# Patient Record
Sex: Male | Born: 1980 | Race: White | Hispanic: No | Marital: Married | Smoking: Never smoker
Health system: Southern US, Community
[De-identification: ages and names within clinical notes are randomized; demographics above are authoritative.]

## PROBLEM LIST (undated history)

## (undated) DIAGNOSIS — M109 Gout, unspecified: Secondary | ICD-10-CM

## (undated) HISTORY — DX: Gout, unspecified: M10.9

---

## 2006-06-14 ENCOUNTER — Ambulatory Visit: Payer: Self-pay | Admitting: General Practice

## 2008-01-23 IMAGING — CR RIGHT FOOT COMPLETE - 3+ VIEW
1 series · 3 of 3 positions shown · non-contrast
Comparison: none

REASON FOR EXAM: Pain
                              Fax report to: [phone]
COMMENTS:

[Series 1: view not recorded · 0.17mm/px · 3 of 3 slices shown]
[im 1/3]
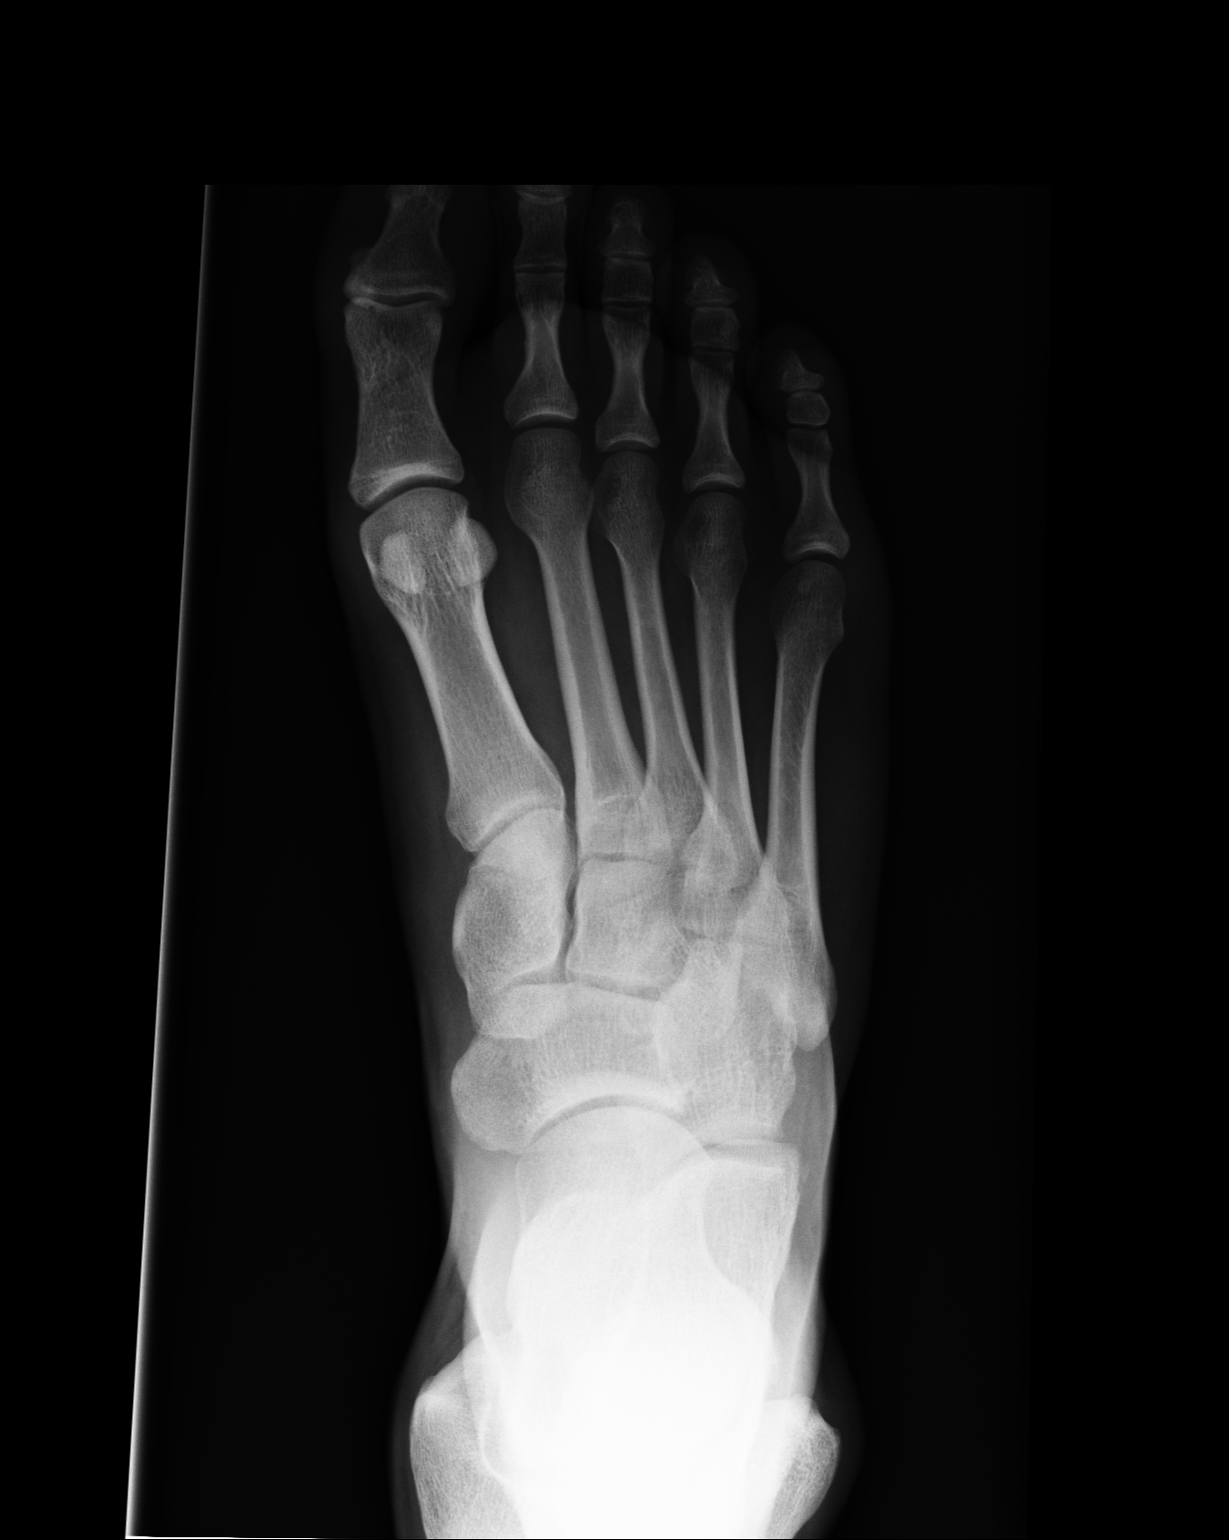
[im 2/3]
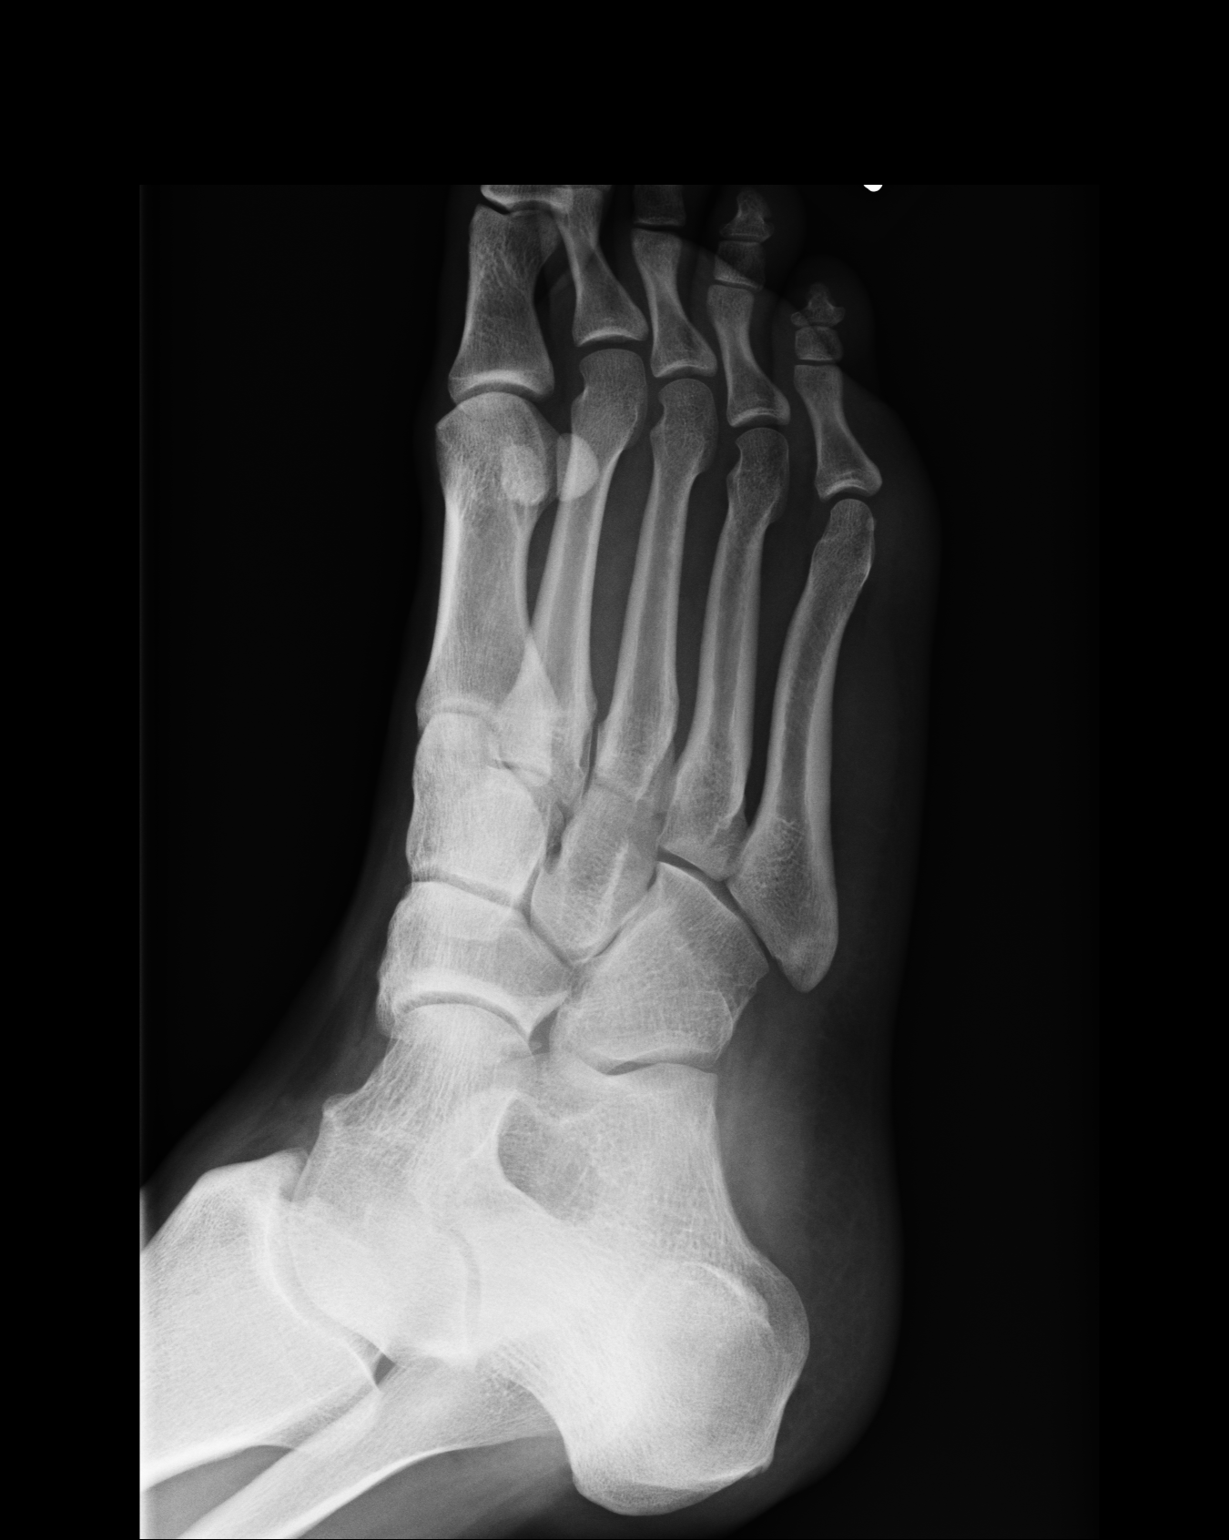
[im 3/3]
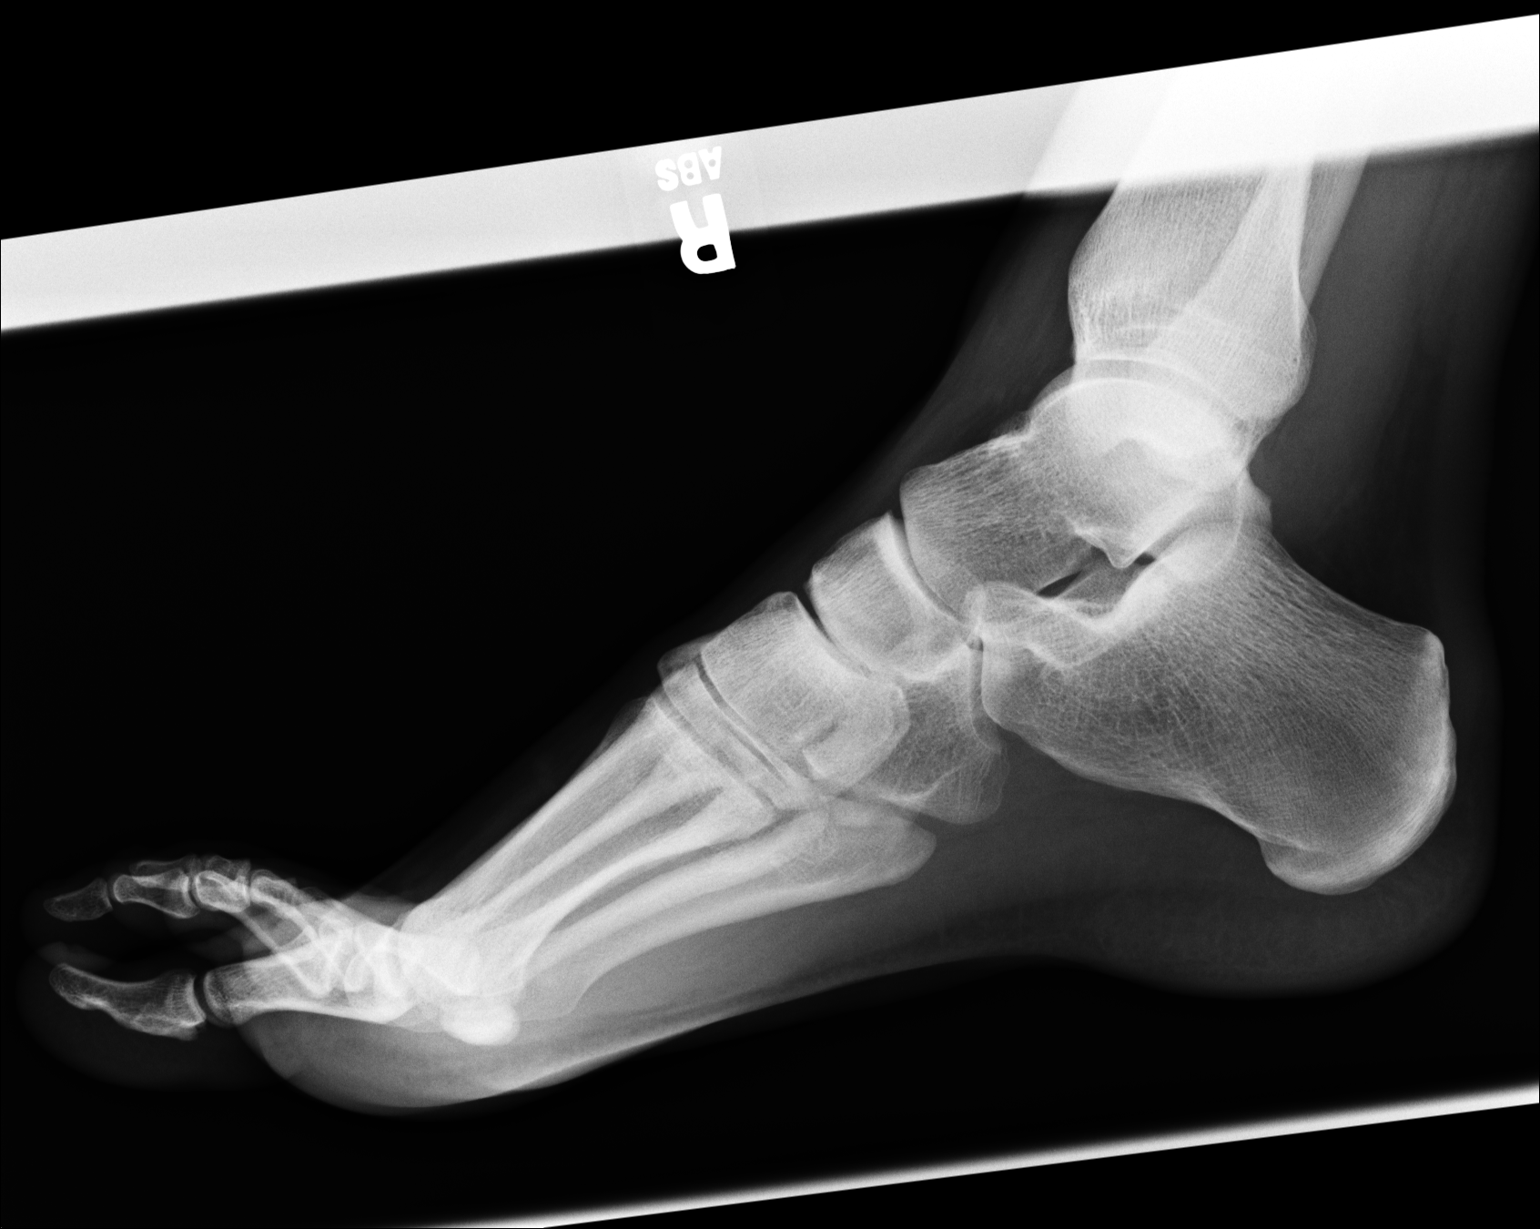

[3 of 3 positions shown; findings below may reference images not displayed]

PROCEDURE:     DXR - DXR FOOT RT COMPLETE W/OBLIQUES  - June 14, 2006 [DATE]

RESULT:     The patient is complaining of RIGHT foot discomfort. The site of
symptoms is not noted in the clinical history.

The bones appear adequately mineralized. The interphalangeal and
metatarsophalangeal joints are normal in appearance. The tarsometatarsal
joints appear normal as well. I do not see evidence of an acute fracture.
IMPRESSION: I do not see acute bony abnormality of the right foot.
Follow-up films coned to any areas of significant persistent symptoms would
be of value.

## 2013-11-08 ENCOUNTER — Emergency Department: Payer: Self-pay | Admitting: Emergency Medicine

## 2013-11-08 LAB — COMPREHENSIVE METABOLIC PANEL
ALK PHOS: 67 U/L
AST: 38 U/L — AB (ref 15–37)
Albumin: 4.4 g/dL (ref 3.4–5.0)
Anion Gap: 4 — ABNORMAL LOW (ref 7–16)
BILIRUBIN TOTAL: 0.6 mg/dL (ref 0.2–1.0)
BUN: 10 mg/dL (ref 7–18)
Calcium, Total: 9.4 mg/dL (ref 8.5–10.1)
Chloride: 106 mmol/L (ref 98–107)
Co2: 29 mmol/L (ref 21–32)
Creatinine: 1.4 mg/dL — ABNORMAL HIGH (ref 0.60–1.30)
EGFR (African American): >60
EGFR (Non-African Amer.): >60
Glucose: 106 mg/dL — ABNORMAL HIGH (ref 65–99)
OSMOLALITY: 277 (ref 275–301)
POTASSIUM: 4.4 mmol/L (ref 3.5–5.1)
SGPT (ALT): 60 U/L (ref 12–78)
Sodium: 139 mmol/L (ref 136–145)
Total Protein: 8.4 g/dL — ABNORMAL HIGH (ref 6.4–8.2)

## 2013-11-08 LAB — CBC
HCT: 45.7 % (ref 40.0–52.0)
HGB: 15.9 g/dL (ref 13.0–18.0)
MCH: 31.6 pg (ref 26.0–34.0)
MCHC: 34.8 g/dL (ref 32.0–36.0)
MCV: 91 fL (ref 80–100)
Platelet: 188 10*3/uL (ref 150–440)
RBC: 5.04 10*6/uL (ref 4.40–5.90)
RDW: 13.2 % (ref 11.5–14.5)
WBC: 6.9 10*3/uL (ref 3.8–10.6)

## 2013-11-08 LAB — LIPASE, BLOOD: Lipase: 197 U/L (ref 73–393)

## 2014-08-28 ENCOUNTER — Other Ambulatory Visit: Payer: Self-pay | Admitting: General Practice

## 2014-08-28 DIAGNOSIS — M545 Low back pain, unspecified: Secondary | ICD-10-CM

## 2014-09-05 ENCOUNTER — Ambulatory Visit
Admission: RE | Admit: 2014-09-05 | Discharge: 2014-09-05 | Disposition: A | Discharge status: Home / Self Care | Payer: 59 | Source: Ambulatory Visit | Attending: General Practice | Admitting: General Practice

## 2014-09-05 DIAGNOSIS — M545 Low back pain, unspecified: Secondary | ICD-10-CM

## 2014-09-05 DIAGNOSIS — M549 Dorsalgia, unspecified: Secondary | ICD-10-CM | POA: Diagnosis present

## 2014-09-05 DIAGNOSIS — M5136 Other intervertebral disc degeneration, lumbar region: Secondary | ICD-10-CM | POA: Insufficient documentation

## 2014-09-05 DIAGNOSIS — M47816 Spondylosis without myelopathy or radiculopathy, lumbar region: Secondary | ICD-10-CM | POA: Diagnosis not present

## 2014-09-05 DIAGNOSIS — M5137 Other intervertebral disc degeneration, lumbosacral region: Secondary | ICD-10-CM | POA: Diagnosis not present

## 2022-01-26 ENCOUNTER — Ambulatory Visit (INDEPENDENT_AMBULATORY_CARE_PROVIDER_SITE_OTHER): Payer: Commercial Managed Care - PPO | Admitting: Family Medicine

## 2022-01-26 ENCOUNTER — Encounter: Payer: Self-pay | Admitting: Family Medicine

## 2022-01-26 VITALS — BP 150/94 | HR 60 | Ht 75.0 in | Wt 267.0 lb

## 2022-01-26 DIAGNOSIS — Z7689 Persons encountering health services in other specified circumstances: Secondary | ICD-10-CM

## 2022-01-26 DIAGNOSIS — G8929 Other chronic pain: Secondary | ICD-10-CM

## 2022-01-26 DIAGNOSIS — M1A09X0 Idiopathic chronic gout, multiple sites, without tophus (tophi): Secondary | ICD-10-CM

## 2022-01-26 DIAGNOSIS — M2550 Pain in unspecified joint: Secondary | ICD-10-CM | POA: Diagnosis not present

## 2022-01-26 DIAGNOSIS — M7660 Achilles tendinitis, unspecified leg: Secondary | ICD-10-CM

## 2022-01-26 NOTE — Progress Notes (Unsigned)
Subjective:    Patient ID: Gregory Brandt, male    DOB: 1980/11/01, 41 y.o.   MRN: [id number]  Gregory Brandt is a 41 y.o. male presenting on 01/26/2022 for Establish Care  Wife established here yesterday as new patient. Has not seen PCP in several years. He has used [facility] more regularly. Biometric through work  HPI  Elevated BP without dx HTN In pain in AM taking aleve, coffee 2 cups  Gout Pain Generalized Joint Pain Admits bilateral ankles, hands wrist He has had gout since high school age, he usually has gout flare pain in toes and rarely he had pain in knee. He admits now pain is in middle of foot. He has chronic Uric Acid elevated Previously on Allopurinol in the past. He said it helped but didn't do a lot. Previously was on Indomethacin with no results. He was then placed on Colchicine and Prednisone PRN, it does work but does not resolve it. Taking a lot of Aleve daily regularly, doesn't resolve the problem. Admits neck pressure and symptoms. If he is very active he will be in increased pain for a few days Played football and baseball through high school and college. Various injuries but no severe injury He used to work Horticulturist, commercial business, and he said nowadays he has very reduced power and strength, loss grip strength Prednisone  He has biometric from work All labs were normal except High TG off the charts and mild low HDL He follows with health coach through work yearly past 4 years, he has improved his diet. May have familial or genetic component for his TG, he meets with dietician every 2 months, he has reduced cholesterol in diet, and reduced carbs starches. - He has had normal blood sugar. - DM does run in family   Health Maintenance: Declines Flu Shot     01/26/2022   10:10 AM  Depression screen PHQ 2/9  Decreased Interest 0  Down, Depressed, Hopeless 0  PHQ - 2 Score 0  Altered sleeping 3  Tired, decreased energy 3  Change in  appetite 3  Feeling bad or failure about yourself  1  Trouble concentrating 0  Moving slowly or fidgety/restless 0  Suicidal thoughts 0  PHQ-9 Score 10  Difficult doing work/chores Very difficult    Past Medical History:  Diagnosis Date   Gout    History reviewed. No pertinent surgical history. Social History   Socioeconomic History   Marital status: Married    Spouse name: Not on file   Number of children: Not on file   Years of education: Not on file   Highest education level: Not on file  Occupational History   Not on file  Tobacco Use   Smoking status: Never   Smokeless tobacco: Current    Types: Chew   Tobacco comments:    Smokeless tobacco 1 can for 25 years  Vaping Use   Vaping Use: Never used  Substance and Sexual Activity   Alcohol use: Yes    Alcohol/week: 1.0 standard drink of alcohol    Types: 1 Standard drinks or equivalent per week   Drug use: Never   Sexual activity: Not on file  Other Topics Concern   Not on file  Social History Narrative   Not on file   Social Determinants of Health   Financial Resource Strain: Not on file  Food Insecurity: Not on file  Transportation Needs: Not on file  Physical Activity: Not on file  Stress: Not on file  Social Connections: Not on file  Intimate Partner Violence: Not on file   History reviewed. No pertinent family history. No current outpatient medications on file prior to visit.   No current facility-administered medications on file prior to visit.    Review of Systems Per HPI unless specifically indicated above      Objective:    BP (!) 150/94 (BP Location: Left Arm, Cuff Size: Normal)   Pulse 60   Ht 6\' 3"  (1.905 m)   Wt 267 lb (121.1 kg)   SpO2 100%   BMI 33.37 kg/m   Wt Readings from Last 3 Encounters:  01/26/22 267 lb (121.1 kg)  09/05/14 250 lb (113.4 kg)    Physical Exam Vitals and nursing note reviewed.  Constitutional:      General: He is not in acute distress.     Appearance: He is well-developed. He is not diaphoretic.     Comments: Well-appearing, comfortable, cooperative  HENT:     Head: Normocephalic and atraumatic.  Eyes:     General:        Right eye: No discharge.        Left eye: No discharge.     Conjunctiva/sclera: Conjunctivae normal.  Neck:     Thyroid: No thyromegaly.  Cardiovascular:     Rate and Rhythm: Normal rate and regular rhythm.     Pulses: Normal pulses.     Heart sounds: Normal heart sounds. No murmur heard. Pulmonary:     Effort: Pulmonary effort is normal. No respiratory distress.     Breath sounds: Normal breath sounds. No wheezing or rales.  Musculoskeletal:        General: Normal range of motion.     Cervical back: Normal range of motion and neck supple.  Lymphadenopathy:     Cervical: No cervical adenopathy.  Skin:    General: Skin is warm and dry.     Findings: No erythema or rash.  Neurological:     Mental Status: He is alert and oriented to person, place, and time. Mental status is at baseline.  Psychiatric:        Behavior: Behavior normal.     Comments: Well groomed, good eye contact, normal speech and thoughts    No results found for this or any previous visit.    Assessment & Plan:   Problem List Items Addressed This Visit   None Visit Diagnoses     Chronic pain of multiple joints    -  Primary   Relevant Orders   COMPLETE METABOLIC PANEL WITH GFR   CBC with Differential/Platelet   Sed Rate (ESR)   C-reactive protein   Cyclic citrul peptide antibody, IgG   Antinuclear Antib (ANA)   Rheumatoid Factor   Achilles tendon pain       Chronic gout of multiple sites, unspecified cause       Relevant Orders   Sed Rate (ESR)   C-reactive protein   Uric acid   Encounter to establish care with new doctor           Establish care  BP improved but still elevated. Recommend check BP on occasion, few times a week if possible. May need to purchase your own cuff. Goal < 140 / 90 (the stretch goal  would be < 135 / 85)  Try to limit or reduce triggers - only so much we can manage with the pain right now, but otherwise caffeine, nicotine and stressors contribute.  Low sodium diet  Lab testing today. Stay tuned for results. Diagnostic panel for possible autoimmune vs rheumatological condition, given chronic nature of gout and various joint / muscle pain syndrome  Will review screening/diagnostic results and anticipate future referral to Rheumatology, list per AVS   No orders of the defined types were placed in this encounter.     Follow up plan: Return in about 6 weeks (around 03/09/2022) for 6 week follow-up BP check, update Rheum.  Nobie Putnam, [facility] 01/26/2022, 10:23 AM

## 2022-01-26 NOTE — Patient Instructions (Addendum)
Thank you for coming to the office today.  BP improved but still elevated. Recommend check BP on occasion, few times a week if possible. May need to purchase your own cuff. Goal < 140 / 90 (the stretch goal would be < 135 / 85)  Try to limit or reduce triggers - only so much we can manage with the pain right now, but otherwise caffeine, nicotine and stressors contribute.  Low sodium diet  Lab testing today. Stay tuned for results.  Once we review everything, probably next week we can collaborate on a final decision for referral.  [facility] Rheumatologist in [place], [place] Address: [address], [place], [place] [address] Phone: [phone]  Tauseef G. Dossie Der, MD Rheumatology [facility] [place], [place], [place], [place]  [phone]  [place], [place] [address] Hours: M-Th 8-5pm / F 8-12noon Phone: [phone] Fax: [phone]  [facility]  [place],  [address] Phone: [phone]   Please schedule a Follow-up Appointment to: Return in about 6 weeks (around 03/09/2022) for 6 week follow-up BP check, update Rheum.  If you have any other questions or concerns, please feel free to call the office or send a message through [place]. You may also schedule an earlier appointment if necessary.  Additionally, you may be receiving a survey about your experience at our office within a few days to 1 week by e-mail or mail. We value your feedback.  Nobie Putnam, DO [facility]   DASH Eating Plan DASH stands for Dietary Approaches to Stop Hypertension. The DASH eating plan is a healthy eating plan that has been shown to: Reduce high blood pressure (hypertension). Reduce your risk for type 2 diabetes, heart disease, and stroke. Help with weight loss. What are tips for following this  plan? Reading food labels Check food labels for the amount of salt (sodium) per serving. Choose foods with less than 5 percent of the Daily Value of sodium. Generally, foods with less than 300 milligrams (mg) of sodium per serving fit into this eating plan. To find whole grains, look for the word "whole" as the first word in the ingredient list. Shopping Buy products labeled as "low-sodium" or "no salt added." Buy fresh foods. Avoid canned foods and pre-made or frozen meals. Cooking Avoid adding salt when cooking. Use salt-free seasonings or herbs instead of table salt or sea salt. Check with your health care provider or pharmacist before using salt substitutes. Do not fry foods. Cook foods using healthy methods such as baking, boiling, grilling, roasting, and broiling instead. Cook with heart-healthy oils, such as olive, canola, avocado, soybean, or sunflower oil. Meal planning  Eat a balanced diet that includes: 4 or more servings of fruits and 4 or more servings of vegetables each day. Try to fill one-half of your plate with fruits and vegetables. 6-8 servings of whole grains each day. Less than 6 oz (170 g) of lean meat, poultry, or fish each day. A 3-oz (85-g) serving of meat is about the same size as a deck of cards. One egg equals 1 oz (28 g). 2-3 servings of low-fat dairy each day. One serving is 1 cup (237 mL). 1 serving of nuts, seeds, or beans 5 times each week. 2-3 servings of heart-healthy fats. Healthy fats called omega-3 fatty acids are found in foods such as walnuts, flaxseeds, fortified milks, and eggs. These  fats are also found in cold-water fish, such as sardines, salmon, and mackerel. Limit how much you eat of: Canned or prepackaged foods. Food that is high in trans fat, such as some fried foods. Food that is high in saturated fat, such as fatty meat. Desserts and other sweets, sugary drinks, and other foods with added sugar. Full-fat dairy products. Do not salt foods  before eating. Do not eat more than 4 egg yolks a week. Try to eat at least 2 vegetarian meals a week. Eat more home-cooked food and less restaurant, buffet, and fast food. Lifestyle When eating at a restaurant, ask that your food be prepared with less salt or no salt, if possible. If you drink alcohol: Limit how much you use to: 0-1 drink a day for women who are not pregnant. 0-2 drinks a day for men. Be aware of how much alcohol is in your drink. In the [place] one drink equals one 12 oz bottle of beer (355 mL), one 5 oz glass of wine (148 mL), or one 1 oz glass of hard liquor (44 mL). General information Avoid eating more than 2,300 mg of salt a day. If you have hypertension, you may need to reduce your sodium intake to 1,500 mg a day. Work with your health care provider to maintain a healthy body weight or to lose weight. Ask what an ideal weight is for you. Get at least 30 minutes of exercise that causes your heart to beat faster (aerobic exercise) most days of the week. Activities may include walking, swimming, or biking. Work with your health care provider or dietitian to adjust your eating plan to your individual calorie needs. What foods should I eat? Fruits All fresh, dried, or frozen fruit. Canned fruit in natural juice (without added sugar). Vegetables Fresh or frozen vegetables (raw, steamed, roasted, or grilled). Low-sodium or reduced-sodium tomato and vegetable juice. Low-sodium or reduced-sodium tomato sauce and tomato paste. Low-sodium or reduced-sodium canned vegetables. Grains Whole-grain or whole-wheat bread. Whole-grain or whole-wheat pasta. Brown rice. Orpah Cobb. Bulgur. Whole-grain and low-sodium cereals. Pita bread. Low-fat, low-sodium crackers. Whole-wheat flour tortillas. Meats and other proteins Skinless chicken or [place]. Ground chicken or [place]. Pork with fat trimmed off. Fish and seafood. Egg whites. Dried beans, peas, or lentils. Unsalted nuts, nut  butters, and seeds. Unsalted canned beans. Lean cuts of beef with fat trimmed off. Low-sodium, lean precooked or cured meat, such as sausages or meat loaves. Dairy Low-fat (1%) or fat-free (skim) milk. Reduced-fat, low-fat, or fat-free cheeses. Nonfat, low-sodium ricotta or cottage cheese. Low-fat or nonfat yogurt. Low-fat, low-sodium cheese. Fats and oils Soft margarine without trans fats. Vegetable oil. Reduced-fat, low-fat, or light mayonnaise and salad dressings (reduced-sodium). Canola, safflower, olive, avocado, soybean, and sunflower oils. Avocado. Seasonings and condiments Herbs. Spices. Seasoning mixes without salt. Other foods Unsalted popcorn and pretzels. Fat-free sweets. The items listed above may not be a complete list of foods and beverages you can eat. Contact a dietitian for more information. What foods should I avoid? Fruits Canned fruit in a light or heavy syrup. Fried fruit. Fruit in cream or butter sauce. Vegetables Creamed or fried vegetables. Vegetables in a cheese sauce. Regular canned vegetables (not low-sodium or reduced-sodium). Regular canned tomato sauce and paste (not low-sodium or reduced-sodium). Regular tomato and vegetable juice (not low-sodium or reduced-sodium). Rosita Fire. Olives. Grains Baked goods made with fat, such as croissants, muffins, or some breads. Dry pasta or rice meal packs. Meats and other proteins Fatty cuts of meat. Ribs.  Fried meat. Tomasa Blase. Bologna, salami, and other precooked or cured meats, such as sausages or meat loaves. Fat from the back of a pig (fatback). Bratwurst. Salted nuts and seeds. Canned beans with added salt. Canned or smoked fish. Whole eggs or egg yolks. Chicken or [place] with skin. Dairy Whole or 2% milk, cream, and half-and-half. Whole or full-fat cream cheese. Whole-fat or sweetened yogurt. Full-fat cheese. Nondairy creamers. Whipped toppings. Processed cheese and cheese spreads. Fats and oils Butter. Stick margarine. Lard.  Shortening. Ghee. Bacon fat. Tropical oils, such as coconut, palm kernel, or palm oil. Seasonings and condiments Onion salt, garlic salt, seasoned salt, table salt, and sea salt. Worcestershire sauce. Tartar sauce. Barbecue sauce. Teriyaki sauce. Soy sauce, including reduced-sodium. Steak sauce. Canned and packaged gravies. Fish sauce. Oyster sauce. Cocktail sauce. Store-bought horseradish. Ketchup. Mustard. Meat flavorings and tenderizers. Bouillon cubes. Hot sauces. Pre-made or packaged marinades. Pre-made or packaged taco seasonings. Relishes. Regular salad dressings. Other foods Salted popcorn and pretzels. The items listed above may not be a complete list of foods and beverages you should avoid. Contact a dietitian for more information. Where to find more information National Heart, Lung, and Blood Institute: PopSteam.is American Heart Association: www.heart.org Academy of Nutrition and Dietetics: www.eatright.org National Kidney Foundation: www.kidney.org Summary The DASH eating plan is a healthy eating plan that has been shown to reduce high blood pressure (hypertension). It may also reduce your risk for type 2 diabetes, heart disease, and stroke. When on the DASH eating plan, aim to eat more fresh fruits and vegetables, whole grains, lean proteins, low-fat dairy, and heart-healthy fats. With the DASH eating plan, you should limit salt (sodium) intake to 2,300 mg a day. If you have hypertension, you may need to reduce your sodium intake to 1,500 mg a day. Work with your health care provider or dietitian to adjust your eating plan to your individual calorie needs. This information is not intended to replace advice given to you by your health care provider. Make sure you discuss any questions you have with your health care provider. Document Revised: 03/22/2019 Document Reviewed: 03/22/2019 Elsevier Patient Education  2023 [employer].

## 2022-01-27 ENCOUNTER — Encounter: Payer: Self-pay | Admitting: Family Medicine

## 2022-01-29 LAB — COMPLETE METABOLIC PANEL WITH GFR
AG Ratio: 1.8 (calc) (ref 1.0–2.5)
ALT: 41 U/L (ref 9–46)
AST: 23 U/L (ref 10–40)
Albumin: 4.9 g/dL (ref 3.6–5.1)
Alkaline phosphatase (APISO): 58 U/L (ref 36–130)
BUN: 11 mg/dL (ref 7–25)
CO2: 25 mmol/L (ref 20–32)
Calcium: 9.8 mg/dL (ref 8.6–10.3)
Chloride: 103 mmol/L (ref 98–110)
Creat: 1.21 mg/dL (ref 0.60–1.29)
Globulin: 2.7 g/dL (calc) (ref 1.9–3.7)
Glucose, Bld: 97 mg/dL (ref 65–139)
Potassium: 4.5 mmol/L (ref 3.5–5.3)
Sodium: 138 mmol/L (ref 135–146)
Total Bilirubin: 0.7 mg/dL (ref 0.2–1.2)
Total Protein: 7.6 g/dL (ref 6.1–8.1)
eGFR: 78 mL/min/{1.73_m2} (ref >=60)

## 2022-01-29 LAB — CBC WITH DIFFERENTIAL/PLATELET
Absolute Monocytes: 568 cells/uL (ref 200–950)
Basophils Absolute: 70 cells/uL (ref 0–200)
Basophils Relative: 1.2 %
Eosinophils Absolute: 209 cells/uL (ref 15–500)
Eosinophils Relative: 3.6 %
HCT: 43.6 % (ref 38.5–50.0)
Hemoglobin: 15 g/dL (ref 13.2–17.1)
Lymphs Abs: 2007 cells/uL (ref 850–3900)
MCH: 31.4 pg (ref 27.0–33.0)
MCHC: 34.4 g/dL (ref 32.0–36.0)
MCV: 91.2 fL (ref 80.0–100.0)
MPV: 9.9 fL (ref 7.5–12.5)
Monocytes Relative: 9.8 %
Neutro Abs: 2946 cells/uL (ref 1500–7800)
Neutrophils Relative %: 50.8 %
Platelets: 215 10*3/uL (ref 140–400)
RBC: 4.78 10*6/uL (ref 4.20–5.80)
RDW: 13.2 % (ref 11.0–15.0)
Total Lymphocyte: 34.6 %
WBC: 5.8 10*3/uL (ref 3.8–10.8)

## 2022-01-29 LAB — SEDIMENTATION RATE: Sed Rate: 2 mm/h (ref 0–15)

## 2022-01-29 LAB — URIC ACID: Uric Acid, Serum: 9.3 mg/dL — ABNORMAL HIGH (ref 4.0–8.0)

## 2022-01-29 LAB — ANA: Anti Nuclear Antibody (ANA): NEGATIVE

## 2022-01-29 LAB — RHEUMATOID FACTOR: Rheumatoid fact SerPl-aCnc: <14 IU/mL (ref <14)

## 2022-01-29 LAB — CYCLIC CITRUL PEPTIDE ANTIBODY, IGG: Cyclic Citrullin Peptide Ab: <16 UNITS

## 2022-01-29 LAB — C-REACTIVE PROTEIN: CRP: 1.4 mg/L (ref <8.0)

## 2022-02-08 ENCOUNTER — Encounter: Payer: Self-pay | Admitting: Family Medicine

## 2022-02-11 ENCOUNTER — Telehealth: Payer: Self-pay | Admitting: Family Medicine

## 2022-02-11 DIAGNOSIS — M1A09X0 Idiopathic chronic gout, multiple sites, without tophus (tophi): Secondary | ICD-10-CM

## 2022-02-11 NOTE — Telephone Encounter (Signed)
Pt is calling to let Dr. Raliegh Ip know that he is in agreement with the rheumatology referral and which dr Raliegh Ip thinks is the best is fine

## 2022-02-14 NOTE — Telephone Encounter (Signed)
Please notify patient.   Referral submitted to   Pearson Forster, MD [address] [place],  [place]  [id number] Get Driving Directions Main: [phone]  Stay tuned for apt. Call them or [place] if need if not heard back 2 weeks  Nobie Putnam, [facility] 02/14/2022, 6:00 PM

## 2022-03-08 ENCOUNTER — Other Ambulatory Visit: Payer: Self-pay | Admitting: Family Medicine

## 2022-03-08 ENCOUNTER — Encounter: Payer: Self-pay | Admitting: Family Medicine

## 2022-03-08 ENCOUNTER — Ambulatory Visit (INDEPENDENT_AMBULATORY_CARE_PROVIDER_SITE_OTHER): Payer: Commercial Managed Care - PPO | Admitting: Family Medicine

## 2022-03-08 VITALS — BP 119/73 | HR 88 | Ht 75.0 in | Wt 260.0 lb

## 2022-03-08 DIAGNOSIS — M2550 Pain in unspecified joint: Secondary | ICD-10-CM

## 2022-03-08 DIAGNOSIS — Z0000 Encounter for general adult medical examination without abnormal findings: Secondary | ICD-10-CM

## 2022-03-08 DIAGNOSIS — Z79899 Other long term (current) drug therapy: Secondary | ICD-10-CM

## 2022-03-08 DIAGNOSIS — M109 Gout, unspecified: Secondary | ICD-10-CM | POA: Diagnosis not present

## 2022-03-08 DIAGNOSIS — Z13220 Encounter for screening for lipoid disorders: Secondary | ICD-10-CM

## 2022-03-08 DIAGNOSIS — M1A09X0 Idiopathic chronic gout, multiple sites, without tophus (tophi): Secondary | ICD-10-CM

## 2022-03-08 DIAGNOSIS — Z131 Encounter for screening for diabetes mellitus: Secondary | ICD-10-CM

## 2022-03-08 DIAGNOSIS — G8929 Other chronic pain: Secondary | ICD-10-CM

## 2022-03-08 MED ORDER — COLCHICINE 0.6 MG PO TABS: ORAL_TABLET | ORAL | 2 refills | Status: DC

## 2022-03-08 MED ORDER — ALLOPURINOL 100 MG PO TABS: 100.0000 mg | ORAL_TABLET | Freq: Every day | ORAL | 1 refills | Status: DC

## 2022-03-08 MED ORDER — PREDNISONE 20 MG PO TABS: ORAL_TABLET | ORAL | 1 refills | Status: DC

## 2022-03-08 NOTE — Patient Instructions (Addendum)
Thank you for coming to the office today.  Keep apt with Dr Benjamine Mola Rheumatology 06/07/22  For gout flare, start prednisone longer taper  Start Allopurinol now 100mg  daily until you see Rheumatology  If new acute flare, start colchicine Start of acute gout flare start 2 tablet = 1.2 mg, repeat 1 tab 0.6 mg after 1 hour. Then take 1 to 2 tab daily for 7-10 days or until resolved.   HOLD Aleve for now, while on Prednisone cannot mix, and prefer to limit or avoid while on colchicine as well. ---  Call for triage if needed for any other issues  Good option for urgent care  [facility] Address: [address] [place], [place] [address] Phone: [phone]  Please schedule a Follow-up Appointment to: Return in about 6 months (around 09/06/2022) for 6 month fasting lab only then 1 week later Annual Physical.  If you have any other questions or concerns, please feel free to call the office or send a message through [place]. You may also schedule an earlier appointment if necessary.  Additionally, you may be receiving a survey about your experience at our office within a few days to 1 week by e-mail or mail. We value your feedback.  Nobie Putnam, DO [place]

## 2022-03-08 NOTE — Progress Notes (Signed)
Subjective:    Patient ID: Gregory Brandt, male    DOB: 1981/02/08, 41 y.o.   MRN: [id number]  Gregory Brandt is a 41 y.o. male presenting on 03/08/2022 for Hypertension and Gout   HPI  Acute on Chronic Gout Flare Initial visit 01/26/22 established care, lab testing autoimmune, only abnormal result was Uric Acid 9.3, and he was referred to [facility], next available apt with Dr Benjamine Mola is 06/07/22, he is concerned about waiting 3 months - New acute gout flare 10/27 with Right mid foot flare, with severe pain and warmth without as much swelling compared to previous years, he also admits feeling pain 1-2 days later in various areas including neck and other joints of body - He had difficulty walking on his R foot, due to severe pain - He called [facility] for acute gout flare, they ordered Prednisone 70m daily x 6-7 days, some improvement, but not long enough course, did not fully resolve. Still experiencing persistent pain today worse than yesterday. - Now taking Aleve 4-6 in AM and PM if need  See last note for background info on past history of gout for years       03/08/2022    8:16 AM 01/26/2022   10:10 AM  Depression screen PHQ 2/9  Decreased Interest 0 0  Down, Depressed, Hopeless 0 0  PHQ - 2 Score 0 0  Altered sleeping 3 3  Tired, decreased energy 3 3  Change in appetite 3 3  Feeling bad or failure about yourself  1 1  Trouble concentrating 0 0  Moving slowly or fidgety/restless 0 0  Suicidal thoughts 0 0  PHQ-9 Score 10 10  Difficult doing work/chores Very difficult Very difficult    Social History   Tobacco Use   Smoking status: Never   Smokeless tobacco: Current    Types: Chew   Tobacco comments:    Smokeless tobacco 1 can for 25 years  Vaping Use   Vaping Use: Never used  Substance Use Topics   Alcohol use: Yes    Alcohol/week: 1.0 standard drink of alcohol    Types: 1 Standard drinks or equivalent per week   Drug use: Never    Review of  Systems Per HPI unless specifically indicated above     Objective:    BP 119/73   Pulse 88   Ht _0  (1.905 m)   Wt 260 lb (117.9 kg)   SpO2 99%   BMI 32.50 kg/m   Wt Readings from Last 3 Encounters:  03/08/22 260 lb (117.9 kg)  01/26/22 267 lb (121.1 kg)  09/05/14 250 lb (113.4 kg)    Physical Exam Vitals and nursing note reviewed.  Constitutional:      General: He is not in acute distress.    Appearance: Normal appearance. He is well-developed. He is not diaphoretic.     Comments: Well-appearing, comfortable, cooperative  HENT:     Head: Normocephalic and atraumatic.  Eyes:     General:        Right eye: No discharge.        Left eye: No discharge.     Conjunctiva/sclera: Conjunctivae normal.  Cardiovascular:     Rate and Rhythm: Normal rate.  Pulmonary:     Effort: Pulmonary effort is normal.  Skin:    General: Skin is warm and dry.     Findings: No erythema or rash.  Neurological:     Mental Status: He is alert and oriented  to person, place, and time.  Psychiatric:        Mood and Affect: Mood normal.        Behavior: Behavior normal.        Thought Content: Thought content normal.     Comments: Well groomed, good eye contact, normal speech and thoughts    Results for orders placed or performed in visit on 01/26/22  COMPLETE METABOLIC PANEL WITH GFR  Result Value Ref Range   Glucose, Bld 97 65 - 139 mg/dL   BUN 11 7 - 25 mg/dL   Creat 1.21 0.60 - 1.29 mg/dL   eGFR 78 > OR = 60 mL/min/1.81m   BUN/Creatinine Ratio SEE NOTE: 6 - 22 (calc)   Sodium 138 135 - 146 mmol/L   Potassium 4.5 3.5 - 5.3 mmol/L   Chloride 103 98 - 110 mmol/L   CO2 25 20 - 32 mmol/L   Calcium 9.8 8.6 - 10.3 mg/dL   Total Protein 7.6 6.1 - 8.1 g/dL   Albumin 4.9 3.6 - 5.1 g/dL   Globulin 2.7 1.9 - 3.7 g/dL (calc)   AG Ratio 1.8 1.0 - 2.5 (calc)   Total Bilirubin 0.7 0.2 - 1.2 mg/dL   Alkaline phosphatase (APISO) 58 36 - 130 U/L   AST 23 10 - 40 U/L   ALT 41 9 - 46 U/L  CBC  with Differential/Platelet  Result Value Ref Range   WBC 5.8 3.8 - 10.8 Thousand/uL   RBC 4.78 4.20 - 5.80 Million/uL   Hemoglobin 15.0 13.2 - 17.1 g/dL   HCT 43.6 38.5 - 50.0 %   MCV 91.2 80.0 - 100.0 fL   MCH 31.4 27.0 - 33.0 pg   MCHC 34.4 32.0 - 36.0 g/dL   RDW 13.2 11.0 - 15.0 %   Platelets 215 140 - 400 Thousand/uL   MPV 9.9 7.5 - 12.5 fL   Neutro Abs 2,946 1,500 - 7,800 cells/uL   Lymphs Abs 2,007 850 - 3,900 cells/uL   Absolute Monocytes 568 200 - 950 cells/uL   Eosinophils Absolute 209 15 - 500 cells/uL   Basophils Absolute 70 0 - 200 cells/uL   Neutrophils Relative % 50.8 %   Total Lymphocyte 34.6 %   Monocytes Relative 9.8 %   Eosinophils Relative 3.6 %   Basophils Relative 1.2 %  Sed Rate (ESR)  Result Value Ref Range   Sed Rate 2 0 - 15 mm/h  C-reactive protein  Result Value Ref Range   CRP 1.4 <<5.8mg/L  Cyclic citrul peptide antibody, IgG  Result Value Ref Range   Cyclic Citrullin Peptide Ab <16 UNITS  Antinuclear Antib (ANA)  Result Value Ref Range   Anti Nuclear Antibody (ANA) NEGATIVE NEGATIVE  Uric acid  Result Value Ref Range   Uric Acid, Serum 9.3 (H) 4.0 - 8.0 mg/dL  Rheumatoid Factor  Result Value Ref Range   Rhuematoid fact SerPl-aCnc <14 <14 IU/mL      Assessment & Plan:   Problem List Items Addressed This Visit   None Visit Diagnoses     Acute gout of right foot, unspecified cause    -  Primary   Relevant Medications   naproxen sodium (ALEVE) 220 MG tablet   colchicine 0.6 MG tablet   predniSONE (DELTASONE) 20 MG tablet   allopurinol (ZYLOPRIM) 100 MG tablet   Chronic pain of multiple joints       Relevant Medications   naproxen sodium (ALEVE) 220 MG tablet   predniSONE (DELTASONE) 20  MG tablet   allopurinol (ZYLOPRIM) 100 MG tablet       Keep apt with Dr Benjamine Mola Rheumatology 06/07/22  For gout flare, start prednisone longer taper now 14 day  Start Allopurinol now 153m daily until you see Rheumatology Caution with flare  provoke Previously on allopurinol has helped before  If new acute flare, start colchicine Start of acute gout flare start 2 tablet = 1.2 mg, repeat 1 tab 0.6 mg after 1 hour. Then take 1 to 2 tab daily for 7-10 days or until resolved.   HOLD Aleve for now, while on Prednisone cannot mix, and prefer to limit or avoid while on colchicine as well. ---  Call for triage if needed for any other issues    Meds ordered this encounter  Medications   colchicine 0.6 MG tablet    Sig: Start of acute gout flare start 2 tablet = 1.2 mg, repeat 1 tab 0.6 mg after 1 hour. Then take 1 to 2 tab daily for 7-10 days or until resolved.    Dispense:  30 tablet    Refill:  2   predniSONE (DELTASONE) 20 MG tablet    Sig: Take 3 tablets daily (670m for 4 days, then take 2 tab daily (4041mfor 4 days, then take 1 tab daily (33m47mor 6 days    Dispense:  26 tablet    Refill:  1   allopurinol (ZYLOPRIM) 100 MG tablet    Sig: Take 1 tablet (100 mg total) by mouth daily.    Dispense:  90 tablet    Refill:  1      Follow up plan: Return in about 6 months (around 09/06/2022) for 6 month fasting lab only then 1 week later Annual Physical.  Future labs ordered for 08/2022  Add Uric acid to labs lipid  AlexNobie Putnam [place]up 03/08/2022, 8:32 AM

## 2022-05-12 ENCOUNTER — Ambulatory Visit (INDEPENDENT_AMBULATORY_CARE_PROVIDER_SITE_OTHER): Payer: Commercial Managed Care - PPO | Admitting: Urology

## 2022-05-12 ENCOUNTER — Encounter: Payer: Self-pay | Admitting: Urology

## 2022-05-12 VITALS — BP 147/101 | HR 77 | Ht 76.0 in | Wt 259.0 lb

## 2022-05-12 DIAGNOSIS — Z3009 Encounter for other general counseling and advice on contraception: Secondary | ICD-10-CM | POA: Diagnosis not present

## 2022-05-12 MED ORDER — DIAZEPAM 5 MG PO TABS: 5.0000 mg | ORAL_TABLET | Freq: Once | ORAL | 0 refills | Status: DC | PRN

## 2022-05-12 NOTE — Progress Notes (Signed)
   05/12/22 10:33 AM   Gregory Brandt [birth year] [id number]  CC: Discuss vasectomy  HPI: 42 year old male here with his wife today to discuss vasectomy.  He has gout but is otherwise healthy.  They have 2 teenage children and do not desire any further biologic pregnancies.  He denies any urinary symptoms or family history of prostate cancer.   PMH: Past Medical History:  Diagnosis Date   Gout     Family History: Family History  Problem Relation Age of Onset   Heart disease Father    Heart disease Paternal Grandfather    Heart attack Paternal Grandfather 9       multiple    Social History:  reports that he has never smoked. His smokeless tobacco use includes chew. He reports current alcohol use of about 1.0 standard drink of alcohol per week. He reports that he does not use drugs.  Physical Exam: BP (!) 147/101 (BP Location: Left Arm, Patient Position: Sitting, Cuff Size: Large)   Pulse 77   Ht 6\' 4"  (1.93 m)   Wt 259 lb (117.5 kg)   BMI 31.53 kg/m    Constitutional:  Alert and oriented, No acute distress. Cardiovascular: No clubbing, cyanosis, or edema. Respiratory: Normal respiratory effort, no increased work of breathing. GI: Abdomen is soft, nontender, nondistended, no abdominal masses GU: Phallus with patent meatus, no lesions, testicles 20 cc and descended bilaterally, vas deferens easily palpable  Assessment & Plan:   42 year old male interested in vasectomy for permanent sterilization.  We discussed the risks and benefits of vasectomy at length.  Vasectomy is intended to be a permanent form of contraception, and does not produce immediate sterility.  Following vasectomy another form of contraception is required until vas occlusion is confirmed by a post-vasectomy semen analysis obtained 2-3 months after the procedure.  Even after vas occlusion is confirmed, vasectomy is not 100% reliable in preventing pregnancy, and the failure rate is approximately  05/1998.  Repeat vasectomy is required in less than 1% of patients.  He should refrain from ejaculation for 1 week after vasectomy.  Options for fertility after vasectomy include vasectomy reversal, and sperm retrieval with in vitro fertilization or ICSI.  These options are not always successful and may be expensive.  Finally, there are other permanent and non-permanent alternatives to vasectomy available. There is no risk of erectile dysfunction, and the volume of semen will be similar to prior, as the majority of the ejaculate is from the prostate and seminal vesicles.   The procedure takes ~20 minutes.  We recommend patients take 5-10 mg of Valium 30 minutes prior, and he will need a driver post-procedure.  Local anesthetic is injected into the scrotal skin and a small segment of the vas deferens is removed, and the ends occluded. The complication rate is approximately 1-2%, and includes bleeding, infection, and development of chronic scrotal pain.  PLAN: Schedule vasectomy Valium sent to pharmacy   Nickolas Madrid, MD 05/12/2022  [facility] [address] [place], [place] [address] [phone]

## 2022-05-12 NOTE — Patient Instructions (Addendum)

## 2022-06-07 ENCOUNTER — Ambulatory Visit: Payer: Commercial Managed Care - PPO

## 2022-06-07 ENCOUNTER — Ambulatory Visit: Payer: Commercial Managed Care - PPO | Attending: Internal Medicine | Admitting: Internal Medicine

## 2022-06-07 ENCOUNTER — Ambulatory Visit (INDEPENDENT_AMBULATORY_CARE_PROVIDER_SITE_OTHER): Payer: Commercial Managed Care - PPO

## 2022-06-07 ENCOUNTER — Encounter: Payer: Self-pay | Admitting: Internal Medicine

## 2022-06-07 VITALS — BP 130/91 | HR 66 | Resp 12 | Ht 76.0 in | Wt 269.0 lb

## 2022-06-07 DIAGNOSIS — M79672 Pain in left foot: Secondary | ICD-10-CM | POA: Diagnosis not present

## 2022-06-07 DIAGNOSIS — M7989 Other specified soft tissue disorders: Secondary | ICD-10-CM | POA: Diagnosis not present

## 2022-06-07 DIAGNOSIS — M7041 Prepatellar bursitis, right knee: Secondary | ICD-10-CM | POA: Diagnosis not present

## 2022-06-07 DIAGNOSIS — M1A00X0 Idiopathic chronic gout, unspecified site, without tophus (tophi): Secondary | ICD-10-CM | POA: Diagnosis not present

## 2022-06-07 DIAGNOSIS — M79671 Pain in right foot: Secondary | ICD-10-CM

## 2022-06-07 MED ORDER — PREDNISONE 10 MG PO TABS: ORAL_TABLET | ORAL | 0 refills | Status: AC

## 2022-06-07 MED ORDER — FEBUXOSTAT 40 MG PO TABS: 40.0000 mg | ORAL_TABLET | Freq: Every day | ORAL | 1 refills | Status: DC

## 2022-06-07 NOTE — Progress Notes (Signed)
Office Visit Note  Patient: Gregory Brandt             Date of Birth: 03-22-1981           MRN: [id number]             PCP: Olin Hauser, DO Referring: Nobie Putnam * Visit Date: 06/07/2022   Subjective:  New Patient (Initial Visit) (Gout)   History of Present Illness: Gregory Brandt is a 42 y.o. male here for evaluation of joint pain and gout on allopurinol 100 mg daily.  He has had gout since about the age of 71.  This for started with classic podagra symptoms affecting the left big toe.  Prior to that had right elbow surgery for a fracture sustained as a child but was not having upper extremity involvement before then.  After repeated episodes in his foot started to have inflammation involving both sides and going up to the knees.  After several years he started experiencing pain and swelling affecting the hands and wrist or up to elbows.  He was started on allopurinol several times but every time caused more frequent or severe flareups shortly after starting the medication.  He never had great relief from colchicine.  He takes Aleve which sometimes control symptoms but often requires prednisone for a major flareup.  Symptoms usually improve right away with prednisone but inflammation starts to come back often within 1 week after finishing short tapers.  More recent problem in just the past few years has been some worsening neck pain. He had 1 previous kidney stone on the left side that was severely painful in 2015. 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  Labs reviewed 12/2021 Uric acid 9.3 ANA neg RF neg CCP neg ESR 2 CRP 1.2   Activities of Daily Living:  Patient reports morning stiffness for 1 hour.   Patient Denies nocturnal pain.  Difficulty dressing/grooming: Denies Difficulty climbing stairs:  Denies Difficulty getting out of chair: Denies Difficulty using hands for taps, buttons, cutlery, and/or writing: Denies  Review of Systems  Constitutional:  Positive for fatigue.  HENT:  Negative for mouth sores and mouth dryness.   Eyes:  Negative for dryness.  Respiratory:  Negative for shortness of breath.   Cardiovascular:  Negative for chest pain and palpitations.  Gastrointestinal:  Negative for blood in stool, constipation and diarrhea.  Endocrine: Negative for increased urination.  Genitourinary:  Negative for involuntary urination.  Musculoskeletal:  Positive for joint pain, joint pain, joint swelling, myalgias, muscle weakness, morning stiffness, muscle tenderness and myalgias. Negative for gait problem.  Skin:  Positive for sensitivity to sunlight. Negative for color change, rash and hair loss.  Allergic/Immunologic: Negative for susceptible to infections.  Neurological:  Negative for dizziness and headaches.  Hematological:  Negative for swollen glands.  Psychiatric/Behavioral:  Negative for depressed mood and sleep disturbance. The patient is not nervous/anxious.     PMFS History:  Patient Active Problem List   Diagnosis Date Noted   Chronic idiopathic gout 06/07/2022   Bilateral hand swelling 06/07/2022   Prepatellar bursitis of right knee 06/07/2022    Past Medical History:  Diagnosis Date   Gout     Family History  Problem Relation Age of Onset   Heart disease Father    Stroke Father    Heart disease Paternal Grandfather    Heart  attack Paternal Grandfather 79       multiple   History reviewed. No pertinent surgical history. Social History   Social History Narrative   Not on file    There is no immunization history on file for this patient.   Objective: Vital Signs: BP (!) 145/95 (BP Location: Right Arm, Patient Position: Sitting, Cuff Size: Large)   Pulse 77   Resp 12   Ht '6\' 4"'$  (1.93 m)   Wt 269 lb (122 kg)   BMI 32.74 kg/m    Physical  Exam Cardiovascular:     Rate and Rhythm: Normal rate and regular rhythm.  Pulmonary:     Effort: Pulmonary effort is normal.     Breath sounds: Normal breath sounds.  Musculoskeletal:     Right lower leg: No edema.     Left lower leg: No edema.  Lymphadenopathy:     Cervical: No cervical adenopathy.  Skin:    General: Skin is warm and dry.     Findings: No rash.  Neurological:     Mental Status: He is alert.  Psychiatric:        Mood and Affect: Mood normal.      Musculoskeletal Exam:  Shoulders full ROM no tenderness or swelling Elbows full ROM no tenderness or swelling Wrists full ROM no tenderness or swelling Fingers full ROM right MCP joint swelling no focal tenderness to pressure Knees full ROM swelling on anterior knee over inferior half of patella, mobile nodule present Ankles full ROM no tenderness or swelling Left 1st MTP severely reduced ROM, no tenderness or swelling   Investigation: No additional findings.  Imaging: No results found.  Recent Labs: Lab Results  Component Value Date   WBC 5.8 01/26/2022   HGB 15.0 01/26/2022   PLT 215 01/26/2022   NA 138 01/26/2022   K 4.5 01/26/2022   CL 103 01/26/2022   CO2 25 01/26/2022   GLUCOSE 97 01/26/2022   BUN 11 01/26/2022   CREATININE 1.21 01/26/2022   BILITOT 0.7 01/26/2022   ALKPHOS 67 11/08/2013   AST 23 01/26/2022   ALT 41 01/26/2022   PROT 7.6 01/26/2022   ALBUMIN 4.4 11/08/2013   CALCIUM 9.8 01/26/2022   GFRAA >60 11/08/2013    Speciality Comments: No specialty comments available.  Procedures:  No procedures performed Allergies: Patient has no known allergies.   Assessment / Plan:     Visit Diagnoses: Chronic idiopathic gout - Plan: XR Hand 2 View Right, XR Hand 2 View Left, XR Foot 2 Views Right, XR Foot 2 Views Left, febuxostat (ULORIC) 40 MG tablet, predniSONE (DELTASONE) 10 MG tablet  Very longstanding gout likely has some baseline abnormal metabolic handling for uric acid due to  such a early age of onset.  X-rays of hands and feet does not show definite erosive disease but numerous cystic changes would be consistent with longstanding gouty arthritis.  Counseled on the importance of maintaining the Uloric treatment while managing inflammatory symptoms to make progress on long-term disease control. I suspect the flareups on allopurinol are more related to uric acid mobilization than true drug intolerance but will try switching to febuxostat 40 mg daily.  Prednisone taper starting from 50 mg for the current inflammation and I suspect he is at very high risk of worsening flareup when starting urate lowering treatment again. Follow-up in 1 month to recheck uric acid level and response to treatment.  Bilateral hand swelling  Chronic hand swelling likely polyarticular gout due to longstanding  uncontrolled disease.  Had previous negative inflammatory markers ANA and rheumatoid antibodies in September.  Prepatellar bursitis of right knee  Current knee swelling looks consistent for prepatellar bursitis may be related to gout could also have some occupation related trauma to the site.  Discussed precautions to minimize trauma trying to use a pad or kneel on the left knee and some rehab instructions provided.  Orders: Orders Placed This Encounter  Procedures   XR Hand 2 View Right   XR Hand 2 View Left   XR Foot 2 Views Right   XR Foot 2 Views Left   Meds ordered this encounter  Medications   febuxostat (ULORIC) 40 MG tablet    Sig: Take 1 tablet (40 mg total) by mouth daily.    Dispense:  30 tablet    Refill:  1   predniSONE (DELTASONE) 10 MG tablet    Sig: Take 5 tablets (50 mg total) by mouth daily with breakfast for 3 days, THEN 4 tablets (40 mg total) daily with breakfast for 3 days, THEN 3 tablets (30 mg total) daily with breakfast for 3 days, THEN 2 tablets (20 mg total) daily with breakfast for 3 days, THEN 1 tablet (10 mg total) daily with breakfast for 3 days.     Dispense:  45 tablet    Refill:  0   Follow-Up Instructions: Return in about 4 weeks (around 07/05/2022).   Collier Salina, MD  Note - This record has been created using [employer].  Chart creation errors have been sought, but may not always  have been located. Such creation errors do not reflect on  the standard of medical care.

## 2022-06-07 NOTE — Patient Instructions (Signed)
Febuxostat Tablets What is this medication? FEBUXOSTAT (feb UX oh stat) prevents gout attacks. It is prescribed when other medications have not worked or cannot be tolerated. It works by decreasing uric acid levels in your body. This medicine may be used for other purposes; ask your health care provider or pharmacist if you have questions. COMMON BRAND NAME(S): Uloric What should I tell my care team before I take this medication? They need to know if you have any of these conditions: Heart disease History of heart attack or stroke Kidney disease Liver disease Taking azathioprine or mercaptopurine An unusual or allergic reaction to febuxostat, other medications, foods, dyes, or preservatives Pregnant or trying to get pregnant Breast-feeding How should I use this medication? Take this medication by mouth with water. Take it as directed on the prescription label at the same time every day. You can take it with or without food. If it upsets your stomach, take it with food. Keep taking it unless your care team tells you to stop. A special MedGuide will be given to you by the pharmacist with each prescription and refill. Be sure to read this information carefully each time. Talk to your care team about the use of this medication in children. Special care may be needed. Overdosage: If you think you have taken too much of this medicine contact a poison control center or emergency room at once. NOTE: This medicine is only for you. Do not share this medicine with others. What if I miss a dose? If you miss a dose, take it as soon as you can. If it is almost time for your next dose, take only that dose. Do not take double or extra doses. What may interact with this medication? Do not take this medication with any of the following: Azathioprine Mercaptopurine This medication may also interact with the following: Aminophylline Certain medications for  cancer Pegloticase Rasburicase Theophylline This list may not describe all possible interactions. Give your health care provider a list of all the medicines, herbs, non-prescription drugs, or dietary supplements you use. Also tell them if you smoke, drink alcohol, or use illegal drugs. Some items may interact with your medicine. What should I watch for while using this medication? Visit your care team for regular checks on your progress. Tell your care team if your symptoms do not start to get better or if they get worse. You will need regular blood tests while you are taking this medication. Your gout may flare up when you are first taking this medication. Do not stop taking this medication, even if you have a flare. Your care team may give you another medication to help treat the gout pain. This medication may cause serious skin reactions. They can happen weeks to months after starting the medication. Contact your care team right away if you notice fevers or flu-like symptoms with a rash. The rash may be red or purple and then turn into blisters or peeling of the skin. You may also notice a red rash with swelling of the face, lips, or lymph nodes in your neck or under your arms. What side effects may I notice from receiving this medication? Side effects that you should report to your care team as soon as possible: Allergic reactions--skin rash, itching, hives, swelling of the face, lips, tongue, or throat Heart attack--pain or tightness in the chest, shoulders, arms, or jaw, nausea, shortness of breath, cold or clammy skin, feeling faint or lightheaded Liver injury--right upper belly pain, loss of appetite,   nausea, light-colored stool, dark yellow or brown urine, yellowing skin or eyes, unusual weakness or fatigue Rash, fever, and swollen lymph nodes Redness, blistering, peeling, or loosening of the skin, including inside the mouth Stroke--sudden numbness or weakness of the face, arm, or leg,  trouble speaking, confusion, trouble walking, loss of balance or coordination, dizziness, severe headache, change in vision Side effects that usually do not require medical attention (report to your care team if they continue or are bothersome): Joint pain Nausea Skin rash This list may not describe all possible side effects. Call your doctor for medical advice about side effects. You may report side effects to FDA at 1-800-FDA-1088. Where should I keep my medication? Keep out of the reach of children and pets. Store at room temperature between 20 and 25 degrees C (68 and 77 degrees F). Protect from light and moisture. Keep the container tightly closed. Get rid of any unused medication after the expiration date. To get rid of medications that are no longer needed or have expired: Take the medication to a medication take-back program. Check with your pharmacy or law enforcement to find a location. If you cannot return the medication, check the label or package insert to see if the medication should be thrown out in the garbage or flushed down the toilet. If you are not sure, ask your care team. If it is safe to put it in the trash, take the medication out of the container. Mix the medication with cat litter, dirt, coffee grounds, or other unwanted substance. Seal the mixture in a bag or container. Put it in the trash. NOTE: This sheet is a summary. It may not cover all possible information. If you have questions about this medicine, talk to your doctor, pharmacist, or health care provider.  2023 Elsevier/Gold Standard (2021-07-21 00:00:00)  

## 2022-06-08 NOTE — Progress Notes (Signed)
Xrays look pretty good there is no evidence of bone erosion type damage from gout. He does have some chronic changes most visible in the left foot and ankle.

## 2022-06-23 ENCOUNTER — Ambulatory Visit (INDEPENDENT_AMBULATORY_CARE_PROVIDER_SITE_OTHER): Payer: Commercial Managed Care - PPO | Admitting: Urology

## 2022-06-23 ENCOUNTER — Encounter: Payer: Commercial Managed Care - PPO | Admitting: Urology

## 2022-06-23 ENCOUNTER — Encounter: Payer: Self-pay | Admitting: Urology

## 2022-06-23 VITALS — BP 147/90 | HR 90 | Ht 76.0 in | Wt 269.0 lb

## 2022-06-23 DIAGNOSIS — Z9852 Vasectomy status: Secondary | ICD-10-CM

## 2022-06-23 DIAGNOSIS — Z302 Encounter for sterilization: Secondary | ICD-10-CM | POA: Diagnosis not present

## 2022-06-23 HISTORY — PX: VASECTOMY: SHX75

## 2022-06-23 NOTE — Patient Instructions (Signed)

## 2022-06-23 NOTE — Progress Notes (Signed)
VASECTOMY PROCEDURE NOTE:  The patient was taken to the minor procedure room and placed in the supine position. His genitals were prepped and draped in the usual sterile fashion. The right vas deferens was brought up to the skin of the right upper scrotum. The skin overlying it was anesthetized with 1% lidocaine without epinephrine, anesthetic was also injected alongside the vas deferens in the direction of the inguinal canal. The no scalpel vasectomy instrument was used to make a small perforation in the scrotal skin. The vasectomy clamp was used to grasp the vas deferens. It was carefully dissected free from surrounding structures. A 1cm segment of the vas was removed, and the cut ends of the mucosa were cauterized. A figure of eight suture was used to perform fascial interposition. No significant bleeding was noted. The vas deferens was returned to the scrotum. The skin incision was closed with a simple interrupted stitch of 4-0 chromic.  Attention was then turned to the left side. The left vasectomy was performed in the same exact fashion. Sterile dressings were placed over each incision. The patient tolerated the procedure well.  IMPRESSION/DIAGNOSIS: The patient is a 42 year old gentleman who underwent a vasectomy today. Post-procedure instructions were reviewed. I stressed the importance of continuing to use birth control until he provides a semen specimen more than 2 months from now that demonstrates azoospermia.  We discussed return precautions including fever over 101, significant bleeding or hematoma, or uncontrolled pain. I also stressed the importance of avoiding strenuous activity for one week, no sexual activity or ejaculations for 5 days, intermittent icing over the next 48 hours, and scrotal support.   PLAN: The patient will be advised of his semen analysis results when available.  Nickolas Madrid, MD 06/23/2022

## 2022-06-29 ENCOUNTER — Other Ambulatory Visit: Payer: Self-pay | Admitting: Internal Medicine

## 2022-06-29 DIAGNOSIS — M1A00X0 Idiopathic chronic gout, unspecified site, without tophus (tophi): Secondary | ICD-10-CM

## 2022-07-04 NOTE — Progress Notes (Unsigned)
Office Visit Note  Patient: Gregory Brandt             Date of Birth: 27-Nov-1980           MRN: [id number]             PCP: Olin Hauser, DO Referring: Nobie Putnam * Visit Date: 07/05/2022   Subjective:  No chief complaint on file.   History of Present Illness: Gregory Brandt is a 42 y.o. male here for follow up ***   Previous HPI 06/07/22 Gregory Brandt is a 42 y.o. male here for evaluation of joint pain and gout on allopurinol 100 mg daily.  He has had gout since about the age of 89.  This for started with classic podagra symptoms affecting the left big toe.  Prior to that had right elbow surgery for a fracture sustained as a child but was not having upper extremity involvement before then.  After repeated episodes in his foot started to have inflammation involving both sides and going up to the knees.  After several years he started experiencing pain and swelling affecting the hands and wrist or up to elbows.  He was started on allopurinol several times but every time caused more frequent or severe flareups shortly after starting the medication.  He never had great relief from colchicine.  He takes Aleve which sometimes control symptoms but often requires prednisone for a major flareup.  Symptoms usually improve right away with prednisone but inflammation starts to come back often within 1 week after finishing short tapers.  More recent problem in just the past few years has been some worsening neck pain. He had 1 previous kidney stone on the left side that was severely painful in 2015. 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   Labs reviewed 12/2021 Uric acid 9.3 ANA neg RF neg CCP neg ESR 2 CRP 1.2   No Rheumatology ROS completed.   PMFS History:  Patient Active Problem List   Diagnosis Date Noted    Chronic idiopathic gout 06/07/2022   Bilateral hand swelling 06/07/2022   Prepatellar bursitis of right knee 06/07/2022    Past Medical History:  Diagnosis Date   Gout     Family History  Problem Relation Age of Onset   Heart disease Father    Stroke Father    Heart disease Paternal Grandfather    Heart attack Paternal Grandfather 39       multiple   No past surgical history on file. Social History   Social History Narrative   Not on file    There is no immunization history on file for this patient.   Objective: Vital Signs: There were no vitals taken for this visit.   Physical Exam   Musculoskeletal Exam: ***  CDAI Exam: CDAI Score: -- Patient Global: --; Provider Global: -- Swollen: --; Tender: -- Joint Exam 07/05/2022   No joint exam has been documented for this visit   There is currently no information documented on the homunculus. Go to the Rheumatology activity and complete the homunculus joint exam.  Investigation: No additional findings.  Imaging: XR Hand 2 View Right  Result Date: 06/07/2022 X-ray right hand 2 views Radiocarpal and carpal joint spaces appear normal.  MCP joints appear normal.  Third PIP with small lateral osteophyte process joint space otherwise preserved.  DIPs appear normal.  No erosions are seen.  Area of increased opacity in the third proximal phalanx could be consistent with inflammatory change or periosteal reaction.  No erosions or demineralization seen. Impression Mild third PIP osteoarthritis and chronic appearing third proximal phalanx change possible posttraumatic or previous inflammatory damage  XR Hand 2 View Left  Result Date: 06/07/2022 X-ray left hand 2 views Radiocarpal carpal joint spaces appear normal.  There are a few cystic changes in the carpal bones.  MCP PIP and DIP joint spaces appear normal.  No erosions or abnormal calcifications seen. Impression Few cystic changes in the carpal bones could be early  osteoarthritis or consistent with chronic nonerosive inflammatory arthritis  XR Foot 2 Views Right  Result Date: 06/07/2022 X-ray right foot 2 views Tibiotalar joint space appears normal.  No significant calcaneal enthesophytes.  Probable very early dorsal osteophyte process at midfoot.  Cystic changes at the fifth metatarsal head with no surrounding erosion or demineralization.  IP joints appear normal.  Bone mineralization is normal. Impression No significant osteoarthritis changes no erosions or demineralization changes  XR Foot 2 Views Left  Result Date: 06/07/2022 X-ray left foot 2 views Appear to be small anterior osteophyte at tibiotalar joint.  Small posterior calcaneal enthesophyte present.  Very early dorsal osteophyte in the midfoot.  MTP joints appear well-preserved.  Cystic appearing changes at the first IP joint with no visible erosion.  Bone mineralization appears normal. 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 with tiny posterior heel spur, cystic change in the first IP joint appears consistent with history of chronic gout   Recent Labs: Lab Results  Component Value Date   WBC 5.8 01/26/2022   HGB 15.0 01/26/2022   PLT 215 01/26/2022   NA 138 01/26/2022   K 4.5 01/26/2022   CL 103 01/26/2022   CO2 25 01/26/2022   GLUCOSE 97 01/26/2022   BUN 11 01/26/2022   CREATININE 1.21 01/26/2022   BILITOT 0.7 01/26/2022   ALKPHOS 67 11/08/2013   AST 23 01/26/2022   ALT 41 01/26/2022   PROT 7.6 01/26/2022   ALBUMIN 4.4 11/08/2013   CALCIUM 9.8 01/26/2022   GFRAA >60 11/08/2013    Speciality Comments: No specialty comments available.  Procedures:  No procedures performed Allergies: Patient has no known allergies.   Assessment / Plan:     Visit Diagnoses: No diagnosis found.  ***  Orders: No orders of the defined types were placed in this encounter.  No orders of the defined types were placed in this encounter.    Follow-Up Instructions: No follow-ups  on file.   Collier Salina, MD  Note - This record has been created using [employer].  Chart creation errors have been sought, but may not always  have been located. Such creation errors do not reflect on  the standard of medical care.

## 2022-07-05 ENCOUNTER — Ambulatory Visit: Payer: Commercial Managed Care - PPO | Attending: Internal Medicine | Admitting: Internal Medicine

## 2022-07-05 ENCOUNTER — Encounter: Payer: Self-pay | Admitting: Internal Medicine

## 2022-07-05 VITALS — BP 142/92 | HR 69 | Resp 12 | Ht 76.0 in | Wt 268.0 lb

## 2022-07-05 DIAGNOSIS — M7041 Prepatellar bursitis, right knee: Secondary | ICD-10-CM

## 2022-07-05 DIAGNOSIS — M1A00X0 Idiopathic chronic gout, unspecified site, without tophus (tophi): Secondary | ICD-10-CM | POA: Diagnosis not present

## 2022-07-05 DIAGNOSIS — M7989 Other specified soft tissue disorders: Secondary | ICD-10-CM

## 2022-07-06 LAB — COMPLETE METABOLIC PANEL WITH GFR
AG Ratio: 1.8 (calc) (ref 1.0–2.5)
ALT: 53 U/L — ABNORMAL HIGH (ref 9–46)
AST: 27 U/L (ref 10–40)
Albumin: 4.8 g/dL (ref 3.6–5.1)
Alkaline phosphatase (APISO): 57 U/L (ref 36–130)
BUN: 10 mg/dL (ref 7–25)
CO2: 28 mmol/L (ref 20–32)
Calcium: 10 mg/dL (ref 8.6–10.3)
Chloride: 105 mmol/L (ref 98–110)
Creat: 1.22 mg/dL (ref 0.60–1.29)
Globulin: 2.7 g/dL (calc) (ref 1.9–3.7)
Glucose, Bld: 89 mg/dL (ref 65–99)
Potassium: 4.8 mmol/L (ref 3.5–5.3)
Sodium: 142 mmol/L (ref 135–146)
Total Bilirubin: 0.6 mg/dL (ref 0.2–1.2)
Total Protein: 7.5 g/dL (ref 6.1–8.1)
eGFR: 76 mL/min/{1.73_m2} (ref >=60)

## 2022-07-06 LAB — URIC ACID: Uric Acid, Serum: 5.6 mg/dL (ref 4.0–8.0)

## 2022-07-06 LAB — SEDIMENTATION RATE: Sed Rate: 6 mm/h (ref 0–15)

## 2022-07-06 MED ORDER — FEBUXOSTAT 40 MG PO TABS: 40.0000 mg | ORAL_TABLET | Freq: Every day | ORAL | 2 refills | Status: DC

## 2022-07-06 NOTE — Progress Notes (Signed)
Uric acid improved a lot from 9.3 to 5.6. I think he is okay to continue the current uloric 40 mg daily and we can reschedule follow up to be in 2-3 months instead of 1. Can keep using aleve as needed for inflammation as well. If he starts to get a major flare up should let us know.

## 2022-08-03 ENCOUNTER — Ambulatory Visit: Payer: Commercial Managed Care - PPO | Admitting: Internal Medicine

## 2022-09-01 ENCOUNTER — Encounter: Payer: Self-pay | Admitting: Family Medicine

## 2022-09-01 ENCOUNTER — Ambulatory Visit (INDEPENDENT_AMBULATORY_CARE_PROVIDER_SITE_OTHER): Payer: Commercial Managed Care - PPO | Admitting: Family Medicine

## 2022-09-01 ENCOUNTER — Other Ambulatory Visit: Payer: Self-pay | Admitting: Family Medicine

## 2022-09-01 VITALS — BP 122/90 | HR 88 | Ht 76.0 in | Wt 265.0 lb

## 2022-09-01 DIAGNOSIS — R7309 Other abnormal glucose: Secondary | ICD-10-CM

## 2022-09-01 DIAGNOSIS — W57XXXA Bitten or stung by nonvenomous insect and other nonvenomous arthropods, initial encounter: Secondary | ICD-10-CM

## 2022-09-01 DIAGNOSIS — S80862A Insect bite (nonvenomous), left lower leg, initial encounter: Secondary | ICD-10-CM | POA: Diagnosis not present

## 2022-09-01 DIAGNOSIS — M62838 Other muscle spasm: Secondary | ICD-10-CM | POA: Diagnosis not present

## 2022-09-01 DIAGNOSIS — R7989 Other specified abnormal findings of blood chemistry: Secondary | ICD-10-CM

## 2022-09-01 DIAGNOSIS — R519 Headache, unspecified: Secondary | ICD-10-CM | POA: Diagnosis not present

## 2022-09-01 DIAGNOSIS — E559 Vitamin D deficiency, unspecified: Secondary | ICD-10-CM

## 2022-09-01 DIAGNOSIS — M1A09X0 Idiopathic chronic gout, multiple sites, without tophus (tophi): Secondary | ICD-10-CM

## 2022-09-01 DIAGNOSIS — Z91014 Allergy to mammalian meats: Secondary | ICD-10-CM

## 2022-09-01 DIAGNOSIS — E538 Deficiency of other specified B group vitamins: Secondary | ICD-10-CM

## 2022-09-01 DIAGNOSIS — R52 Pain, unspecified: Secondary | ICD-10-CM

## 2022-09-01 DIAGNOSIS — Z79899 Other long term (current) drug therapy: Secondary | ICD-10-CM

## 2022-09-01 DIAGNOSIS — Z13220 Encounter for screening for lipoid disorders: Secondary | ICD-10-CM

## 2022-09-01 MED ORDER — CYCLOBENZAPRINE HCL 10 MG PO TABS: 5.0000 mg | ORAL_TABLET | Freq: Three times a day (TID) | ORAL | 0 refills | Status: DC | PRN

## 2022-09-01 MED ORDER — BACLOFEN 10 MG PO TABS: 5.0000 mg | ORAL_TABLET | Freq: Three times a day (TID) | ORAL | 0 refills | Status: DC | PRN

## 2022-09-01 MED ORDER — DOXYCYCLINE HYCLATE 100 MG PO TABS: 100.0000 mg | ORAL_TABLET | Freq: Two times a day (BID) | ORAL | 0 refills | Status: DC

## 2022-09-01 NOTE — Addendum Note (Signed)
Addended by: Smitty Cords on: 09/01/2022 07:09 PM   Modules accepted: Orders

## 2022-09-01 NOTE — Progress Notes (Signed)
Subjective:    Patient ID: Gregory Brandt, male    DOB: 1980-07-11, 42 y.o.   MRN: [id number]  Gregory Brandt is a 42 y.o. male presenting on 09/01/2022 for Generalized Body Aches (Present 2 weeks, history of tick bite)   HPI  Body Aches / Headache / Tick Bite  Reports 2 weeks following tick bite he has experienced muscle aches whole body and skin sensitivity, some fever reported in evening. Left sided muscle spasm and strain and pinching the nerves on that side. Never had rash from Tick Admits diarrhea Denies any significant respiratory symptoms He did COVID test negative multiple times  Updated Chronic Gout Followed by Rheumatology Dr Dimple Casey [facility] [place] He is doing well on Uloric, Uric Acid 9.6 down to 5.2, major improvement now overall  2 months return for labs      03/08/2022    8:16 AM 01/26/2022   10:10 AM  Depression screen PHQ 2/9  Decreased Interest 0 0  Down, Depressed, Hopeless 0 0  PHQ - 2 Score 0 0  Altered sleeping 3 3  Tired, decreased energy 3 3  Change in appetite 3 3  Feeling bad or failure about yourself  1 1  Trouble concentrating 0 0  Moving slowly or fidgety/restless 0 0  Suicidal thoughts 0 0  PHQ-9 Score 10 10  Difficult doing work/chores Very difficult Very difficult    Social History   Tobacco Use   Smoking status: Never    Passive exposure: Past   Smokeless tobacco: Current    Types: Chew   Tobacco comments:    Smokeless tobacco 1 can for 25 years  Vaping Use   Vaping Use: Never used  Substance Use Topics   Alcohol use: Yes    Comment: rarely   Drug use: Never    Review of Systems Per HPI unless specifically indicated above     Objective:    BP (!) 122/90   Pulse 88   Ht 6\' 4"  (1.93 m)   Wt 265 lb (120.2 kg)   SpO2 100%   BMI 32.26 kg/m   Wt Readings from Last 3 Encounters:  09/01/22 265 lb (120.2 kg)  07/05/22 268 lb (121.6 kg)  06/23/22 269 lb (122 kg)    Physical Exam Vitals and  nursing note reviewed.  Constitutional:      General: He is not in acute distress.    Appearance: Normal appearance. He is well-developed. He is not diaphoretic.     Comments: Well-appearing, comfortable, cooperative  HENT:     Head: Normocephalic and atraumatic.  Eyes:     General:        Right eye: No discharge.        Left eye: No discharge.     Conjunctiva/sclera: Conjunctivae normal.  Cardiovascular:     Rate and Rhythm: Normal rate.  Pulmonary:     Effort: Pulmonary effort is normal.  Musculoskeletal:     Comments: Muscle hypertonicity and sensitivity of trapezius L>R  Skin:    General: Skin is warm and dry.     Findings: No erythema or rash (No tick rash).  Neurological:     Mental Status: He is alert and oriented to person, place, and time.  Psychiatric:        Mood and Affect: Mood normal.        Behavior: Behavior normal.        Thought Content: Thought content normal.     Comments: Well  groomed, good eye contact, normal speech and thoughts    Results for orders placed or performed in visit on 07/05/22  Sedimentation rate  Result Value Ref Range   Sed Rate 6 0 - 15 mm/h  Uric acid  Result Value Ref Range   Uric Acid, Serum 5.6 4.0 - 8.0 mg/dL  COMPLETE METABOLIC PANEL WITH GFR  Result Value Ref Range   Glucose, Bld 89 65 - 99 mg/dL   BUN 10 7 - 25 mg/dL   Creat 9.60 4.54 - 0.98 mg/dL   eGFR 76 > OR = 60 [id number]   BUN/Creatinine Ratio SEE NOTE: 6 - 22 (calc)   Sodium 142 135 - 146 mmol/L   Potassium 4.8 3.5 - 5.3 mmol/L   Chloride 105 98 - 110 mmol/L   CO2 28 20 - 32 mmol/L   Calcium 10.0 8.6 - 10.3 mg/dL   Total Protein 7.5 6.1 - 8.1 g/dL   Albumin 4.8 3.6 - 5.1 g/dL   Globulin 2.7 1.9 - 3.7 g/dL (calc)   AG Ratio 1.8 1.0 - 2.5 (calc)   Total Bilirubin 0.6 0.2 - 1.2 mg/dL   Alkaline phosphatase (APISO) 57 36 - 130 U/L   AST 27 10 - 40 U/L   ALT 53 (H) 9 - 46 U/L      Assessment & Plan:   Problem List Items Addressed This Visit    None Visit Diagnoses     Body aches    -  Primary   Relevant Medications   cyclobenzaprine (FLEXERIL) 10 MG tablet   baclofen (LIORESAL) 10 MG tablet   Trapezius muscle spasm       Relevant Medications   cyclobenzaprine (FLEXERIL) 10 MG tablet   baclofen (LIORESAL) 10 MG tablet   Acute nonintractable headache, unspecified headache type       Relevant Medications   cyclobenzaprine (FLEXERIL) 10 MG tablet   baclofen (LIORESAL) 10 MG tablet   Tick bite of left lower leg, initial encounter       Relevant Medications   doxycycline (VIBRA-TABS) 100 MG tablet       Unsure exact cause of the muscle ache symptoms but it could be tick borne or possibly other viral cause.  Empiric coverage Start taking Doxycycline antibiotic 100mg  twice daily for 10 days. Take with full glass of water and stay upright for at least 30 min after taking, may be seated or standing, but should NOT lay down. This is just a safety precaution, if this medicine does not go all the way down throat well it could cause some burning discomfort to throat and esophagus.  Muscle spasm / pain Start Cyclobenzapine (Flexeril) 10mg  tablets (muscle relaxant) - start with half (cut) to one whole pill at night for muscle relaxant - may make you sedated or sleepy (be careful driving or working on this) if tolerated you can take half to whole tab 2 to 3 times daily or every 8 hours as needed  Back up plan - Baclofen less drowsy if need during the day.  ----------  He does describe some possible meat allergy symptoms with abnormal digestion following eating meat Requesting vitamin Deficiency as well  We will check all routine labs, Lyme, Alpha Gal allergy (meat), Vitamin D and B12  Meds ordered this encounter  Medications   doxycycline (VIBRA-TABS) 100 MG tablet    Sig: Take 1 tablet (100 mg total) by mouth 2 (two) times daily. For 10 days. Take with full glass of water, stay upright  30 min after taking.    Dispense:  20  tablet    Refill:  0   cyclobenzaprine (FLEXERIL) 10 MG tablet    Sig: Take 0.5-1 tablets (5-10 mg total) by mouth 3 (three) times daily as needed for muscle spasms.    Dispense:  30 tablet    Refill:  0   baclofen (LIORESAL) 10 MG tablet    Sig: Take 0.5-1 tablets (5-10 mg total) by mouth 3 (three) times daily as needed for muscle spasms.    Dispense:  30 each    Refill:  0      Follow up plan: Return in about 7 weeks (around 10/20/2022) for 7 weeks fasting lab only then 1 week later Follow Up Lab results.  Future labs ordered for 10/20/22   Tick Lyme Alpha Gal Vitamins and other reschedule   Saralyn Pilar, DO [facility] 09/01/2022, 9:39 AM

## 2022-09-01 NOTE — Patient Instructions (Addendum)
Thank you for coming to the office today.  Unsure exact cause of the muscle ache symptoms but it could be tick borne or possibly other viral cause.  Start taking Doxycycline antibiotic 100mg  twice daily for 10 days. Take with full glass of water and stay upright for at least 30 min after taking, may be seated or standing, but should NOT lay down. This is just a safety precaution, if this medicine does not go all the way down throat well it could cause some burning discomfort to throat and esophagus.  Start Cyclobenzapine (Flexeril) 10mg  tablets (muscle relaxant) - start with half (cut) to one whole pill at night for muscle relaxant - may make you sedated or sleepy (be careful driving or working on this) if tolerated you can take half to whole tab 2 to 3 times daily or every 8 hours as needed  Back up plan - Baclofen less drowsy if need during the day.  ----------  We will check all routine labs, Lyme, Alpha Gal allergy (meat), Vitamin D and B12  DUE for FASTING BLOOD WORK (no food or drink after midnight before the lab appointment, only water or coffee without cream/sugar on the morning of)  SCHEDULE "Lab Only" visit in the morning at the clinic for lab draw in 7 WEEKS  - Make sure Lab Only appointment is at about 1 week before your next appointment, so that results will be available  For Lab Results, once available within 2-3 days of blood draw, you can can log in to MyChart online to view your results and a brief explanation. Also, we can discuss results at next follow-up visit.    Please schedule a Follow-up Appointment to: Return in about 7 weeks (around 10/20/2022) for 7 weeks fasting lab only then 1 week later Follow Up Lab results.  If you have any other questions or concerns, please feel free to call the office or send a message through MyChart. You may also schedule an earlier appointment if necessary.  Additionally, you may be receiving a survey about your experience at our  office within a few days to 1 week by e-mail or mail. We value your feedback.  Saralyn Pilar, DO [facility], [place]

## 2022-09-21 ENCOUNTER — Other Ambulatory Visit: Payer: Commercial Managed Care - PPO

## 2022-09-27 ENCOUNTER — Other Ambulatory Visit (INDEPENDENT_AMBULATORY_CARE_PROVIDER_SITE_OTHER): Payer: Commercial Managed Care - PPO

## 2022-09-27 DIAGNOSIS — Z9852 Vasectomy status: Secondary | ICD-10-CM

## 2022-09-28 LAB — POST-VAS SPERM EVALUATION,QUAL: Volume: 1 mL

## 2022-10-03 ENCOUNTER — Ambulatory Visit: Admission: EM | Admit: 2022-10-03 | Payer: Commercial Managed Care - PPO | Source: Home / Self Care

## 2022-10-11 ENCOUNTER — Ambulatory Visit: Payer: Commercial Managed Care - PPO | Admitting: Internal Medicine

## 2022-10-20 ENCOUNTER — Other Ambulatory Visit: Payer: Commercial Managed Care - PPO

## 2022-10-27 ENCOUNTER — Ambulatory Visit: Payer: Commercial Managed Care - PPO | Admitting: Family Medicine

## 2022-10-31 ENCOUNTER — Encounter: Payer: Self-pay | Admitting: Family Medicine

## 2023-11-07 ENCOUNTER — Other Ambulatory Visit: Payer: Self-pay | Admitting: Urology

## 2023-11-08 LAB — POST VAS SEMEN ANALYSIS, AUA: Volume: 1.1 mL

## 2023-11-09 ENCOUNTER — Ambulatory Visit: Payer: Self-pay | Admitting: Urology

## 2024-02-19 ENCOUNTER — Ambulatory Visit: Admitting: Family Medicine

## 2024-02-19 ENCOUNTER — Encounter: Payer: Self-pay | Admitting: Family Medicine

## 2024-02-19 VITALS — BP 134/80 | HR 76 | Ht 76.0 in | Wt 272.0 lb

## 2024-02-19 DIAGNOSIS — F5104 Psychophysiologic insomnia: Secondary | ICD-10-CM | POA: Diagnosis not present

## 2024-02-19 DIAGNOSIS — M1A09X0 Idiopathic chronic gout, multiple sites, without tophus (tophi): Secondary | ICD-10-CM | POA: Diagnosis not present

## 2024-02-19 LAB — CBC WITH DIFFERENTIAL/PLATELET
Absolute Lymphocytes: 2472 {cells}/uL (ref 850–3900)
Absolute Monocytes: 714 {cells}/uL (ref 200–950)
Basophils Absolute: 94 {cells}/uL (ref 0–200)
Basophils Relative: 1 %
Eosinophils Absolute: 197 {cells}/uL (ref 15–500)
Eosinophils Relative: 2.1 %
HCT: 45 % (ref 38.5–50.0)
Hemoglobin: 15.5 g/dL (ref 13.2–17.1)
MCH: 31.3 pg (ref 27.0–33.0)
MCHC: 34.4 g/dL (ref 32.0–36.0)
MCV: 90.7 fL (ref 80.0–100.0)
MPV: 10.1 fL (ref 7.5–12.5)
Monocytes Relative: 7.6 %
Neutro Abs: 5922 {cells}/uL (ref 1500–7800)
Neutrophils Relative %: 63 %
Platelets: 216 Thousand/uL (ref 140–400)
RBC: 4.96 Million/uL (ref 4.20–5.80)
RDW: 13 % (ref 11.0–15.0)
Total Lymphocyte: 26.3 %
WBC: 9.4 Thousand/uL (ref 3.8–10.8)

## 2024-02-19 LAB — COMPREHENSIVE METABOLIC PANEL WITH GFR
AG Ratio: 1.8 (calc) (ref 1.0–2.5)
ALT: 42 U/L (ref 9–46)
AST: 19 U/L (ref 10–40)
Albumin: 4.9 g/dL (ref 3.6–5.1)
Alkaline phosphatase (APISO): 54 U/L (ref 36–130)
BUN: 14 mg/dL (ref 7–25)
CO2: 29 mmol/L (ref 20–32)
Calcium: 10.2 mg/dL (ref 8.6–10.3)
Chloride: 102 mmol/L (ref 98–110)
Creat: 1.27 mg/dL (ref 0.60–1.29)
Globulin: 2.7 g/dL (ref 1.9–3.7)
Glucose, Bld: 89 mg/dL (ref 65–99)
Potassium: 4.6 mmol/L (ref 3.5–5.3)
Sodium: 141 mmol/L (ref 135–146)
Total Bilirubin: 0.6 mg/dL (ref 0.2–1.2)
Total Protein: 7.6 g/dL (ref 6.1–8.1)
eGFR: 72 mL/min/1.73m2 (ref >=60)

## 2024-02-19 LAB — URIC ACID: Uric Acid, Serum: 9.6 mg/dL — ABNORMAL HIGH (ref 4.0–8.0)

## 2024-02-19 MED ORDER — PREDNISONE 10 MG PO TABS: ORAL_TABLET | ORAL | 0 refills | Status: DC

## 2024-02-19 MED ORDER — FEBUXOSTAT 40 MG PO TABS: 40.0000 mg | ORAL_TABLET | Freq: Every day | ORAL | 5 refills | Status: AC

## 2024-02-19 NOTE — Progress Notes (Signed)
 Subjective:    Patient ID: Gregory Brandt, male    DOB: December 15, 1980, 43 y.o.   MRN: [id number]  Gregory Brandt is a 43 y.o. male presenting on 02/19/2024 for Medical Management of Chronic Issues   HPI  Discussed the use of AI scribe software for clinical note transcription with the patient, who gave verbal consent to proceed.  History of Present Illness   Teodoro Jeffreys Mary Hockey is a 43 year old male with gout who presents with a request to restart febuxostat  due to recurrent gout attacks.  Chronic Gout Polyarticular joint pain and gout flares Last visit Dr Jeannetta Osf [facility] 07/05/22 Previously on Allopurinol , failed.  - Recurrent gout attacks since discontinuing febuxostat , affecting multiple joints including neck and shoulders - Acute gout attack two weeks ago while at the beach - Previously prescribed febuxostat  40 mg by rheumatology, which effectively controlled symptoms - Discontinued febuxostat  due to increased cost from $50 to $350 Allopurinol  previously trialed without success - Seeking to restart febuxostat  due to prior efficacy Previously Uric Acid went from 9.3 down to 5.6   Sleep disturbance / insomnia - Difficulty initiating sleep due to inability to 'turn off' mind at night - Attempts at improving sleep hygiene and lifestyle changes, including reducing sugar intake and quitting tobacco use - No use of melatonin  Hypertriglyceridemia - History of elevated triglycerides, reportedly in the 400-500 mg/dL range - Belief that hypertriglyceridemia may be hereditary       02/19/2024    2:46 PM 03/08/2022    8:16 AM 01/26/2022   10:10 AM  Depression screen PHQ 2/9  Decreased Interest 2 0 0  Down, Depressed, Hopeless 0 0 0  PHQ - 2 Score 2 0 0  Altered sleeping 3 3 3   Tired, decreased energy 2 3 3   Change in appetite 0 3 3  Feeling bad or failure about yourself  0 1 1  Trouble concentrating 0 0 0  Moving slowly or  fidgety/restless 0 0 0  Suicidal thoughts 0 0 0  PHQ-9 Score 7 10 10   Difficult doing work/chores Somewhat difficult Very difficult Very difficult       02/19/2024    2:47 PM 03/08/2022    8:16 AM 01/26/2022   10:10 AM  GAD 7 : Generalized Anxiety Score  Nervous, Anxious, on Edge 0 0 0  Control/stop worrying 0 0 0  Worry too much - different things 0 0 0  Trouble relaxing 0 0 0  Restless 0 0 0  Easily annoyed or irritable 0 2 2  Afraid - awful might happen 0 0 0  Total GAD 7 Score 0 2 2  Anxiety Difficulty Somewhat difficult Not difficult at all Not difficult at all    Social History   Tobacco Use   Smoking status: Never    Passive exposure: Past   Smokeless tobacco: Current    Types: Chew   Tobacco comments:    Smokeless tobacco 1 can for 25 years  Vaping Use   Vaping status: Never Used  Substance Use Topics   Alcohol use: Yes    Comment: rarely   Drug use: Never    Review of Systems Per HPI unless specifically indicated above     Objective:    BP 134/80 (BP Location: Left Arm, Patient Position: Sitting, Cuff Size: Large)   Pulse 76   Ht 6' 4 (1.93 m)   Wt 272 lb (123.4 kg)   SpO2 98%  BMI 33.11 kg/m   Wt Readings from Last 3 Encounters:  02/19/24 272 lb (123.4 kg)  09/01/22 265 lb (120.2 kg)  07/05/22 268 lb (121.6 kg)    Physical Exam Vitals and nursing note reviewed.  Constitutional:      General: He is not in acute distress.    Appearance: Normal appearance. He is well-developed. He is not diaphoretic.     Comments: Well-appearing, comfortable, cooperative  HENT:     Head: Normocephalic and atraumatic.  Eyes:     General:        Right eye: No discharge.        Left eye: No discharge.     Conjunctiva/sclera: Conjunctivae normal.  Cardiovascular:     Rate and Rhythm: Normal rate.  Pulmonary:     Effort: Pulmonary effort is normal.  Skin:    General: Skin is warm and dry.     Findings: No erythema or rash.  Neurological:     Mental  Status: He is alert and oriented to person, place, and time.  Psychiatric:        Mood and Affect: Mood normal.        Behavior: Behavior normal.        Thought Content: Thought content normal.     Comments: Well groomed, good eye contact, normal speech and thoughts     Results for orders placed or performed in visit on 11/07/23  Post VAS Semen Analysis, AUA   Collection Time: 11/07/23 12:00 AM  Result Value Ref Range   Volume 1.1 Not Estab. mL   Total Immotile Sperm Comment <0.10 x10E6/mL   Total Motile Sperm Comment x10E6/mL      Assessment & Plan:   Problem List Items Addressed This Visit     Chronic gout of multiple sites - Primary   Relevant Medications   febuxostat  (ULORIC ) 40 MG tablet   predniSONE  (DELTASONE ) 10 MG tablet   Other Relevant Orders   Uric acid   Comprehensive metabolic panel with GFR   CBC with Differential/Platelet   Psychophysiological insomnia     Gout Chronic gout with recent exacerbation affecting multiple joints.  Previously [facility] by [facility] Dr [facility] has not been back since 2024 due to cost of spceialist Previous febuxostat  40mg  effective but discontinued due to cost. - Failed Allopurinol   - Order febuxostat  40 mg, 30 pills with 5 refills. - Order prednisone  taper as previously prescribed during initiation of Febuxostat  - Has Colchicine  if need PRN - Check uric acid level and basic chemistry panel. - Schedule follow-up blood work in three months to assess medication efficacy. - Coordinate with insurance for febuxostat  coverage authorization  Hypertension Blood pressure 134/80 mmHg, possibly related to gout. Reassess after gout management is optimized. - Monitor blood pressure at future visits. - Reassess blood pressure management after gout treatment is stabilized.  Insomnia Chronic insomnia with difficulty initiating sleep. Previous lifestyle modifications included reducing sugar intake and using THC products,  which caused morning grogginess. - Recommend over-the-counter melatonin starting at 5 mg. - Advise on sleep hygiene practices including maintaining a cool room, using a weighted blanket, and blackout curtains.        Orders Placed This Encounter  Procedures   Uric acid   Comprehensive metabolic panel with GFR   CBC with Differential/Platelet    Meds ordered this encounter  Medications   febuxostat  (ULORIC ) 40 MG tablet    Sig: Take 1 tablet (40 mg total) by mouth daily.  Dispense:  30 tablet    Refill:  5   predniSONE  (DELTASONE ) 10 MG tablet    Sig: Sig: Take 5 tablets (50 mg total) by mouth daily with breakfast for 3 days, THEN 4 tablets (40 mg total) daily with breakfast for 3 days, THEN 3 tablets (30 mg total) daily with breakfast for 3 days, THEN 2 tablets (20 mg total) daily with breakfast for 3 days, THEN 1 tablet (10 mg total) daily with breakfast for 3 days.    Dispense:  45 tablet    Refill:  0    Follow up plan: Return for 3 month Annual Physical Tues afternoon 2pm or later, fasting labs AFTER.    Marsa Officer, DO [facility] 02/19/2024, 3:21 PM

## 2024-02-19 NOTE — Patient Instructions (Addendum)
 Thank you for coming to the office today.  Start Febuxostat  Uloric  Generic 40mg  daily  Prednisone  Sig: Take 5 tablets (50 mg total) by mouth daily with breakfast for 3 days, THEN 4 tablets (40 mg total) daily with breakfast for 3 days, THEN 3 tablets (30 mg total) daily with breakfast for 3 days, THEN 2 tablets (20 mg total) daily with breakfast for 3 days, THEN 1 tablet (10 mg total) daily with breakfast for 3 days.   -------------------------------------------------  OTC Melatonin up to 5 to 10mg .  Future consider Buspar  Sleep Hygiene Recommendations to promote healthy sleep in all patients, especially if symptoms of insomnia are worsening. Due to the nature of sleep rhythms, if your body gets out of rhythm, it may take some time before your sleep cycle can be reset.  Please try to follow as many of the following tips as you can, usually there are only a few of these are the primary cause of the problem.  ?To reset your sleep rhythm, go to bed and get up at the same time every day ?Sleep only long enough to feel rested and then get out of bed ?Do not try to force yourself to sleep. If you can't sleep, get out of bed and try again later. ?Avoid naps during the day, unless excessively tired. The more sleeping during the day, then the less sleep your body needs at night.  ?Have coffee, tea, and other foods that have caffeine only in the morning ?Exercise several days a week, but not right before bed ?If you drink alcohol, prefer to have appropriate drink with one meal, but prefer to avoid alcohol in the evening, and bedtime ?If you smoke, avoid smoking, especially in the evening  ?Avoid watching TV or looking at phones, computers, or reading devices (e-books) that give off light at least 30 minutes before bed. This artificial light sends awake signals to your brain and can make it harder to fall asleep. ?Make your bedroom a comfortable place where it is easy to fall asleep: Put  up shades or special blackout curtains to block light from outside. Use a white noise machine to block noise. Keep the temperature cool. ?Try your best to solve or at least address your problems before you go to bed ?Use relaxation techniques to manage stress. Ask your health care provider to suggest some techniques that may work well for you. These may include: Breathing exercises. Routines to release muscle tension. Visualizing peaceful scenes.   Please schedule a Follow-up Appointment to: Return for 3 month Annual Physical Tues afternoon 2pm or later, fasting labs AFTER.  If you have any other questions or concerns, please feel free to call the office or send a message through MyChart. You may also schedule an earlier appointment if necessary.  Additionally, you may be receiving a survey about your experience at our office within a few days to 1 week by e-mail or mail. We value your feedback.  Marsa Officer, DO [facility], [place]

## 2024-02-21 ENCOUNTER — Ambulatory Visit: Payer: Self-pay | Admitting: Family Medicine

## 2024-02-27 ENCOUNTER — Other Ambulatory Visit (HOSPITAL_COMMUNITY): Payer: Self-pay

## 2024-02-27 ENCOUNTER — Telehealth: Payer: Self-pay | Admitting: Pharmacy Technician

## 2024-02-27 NOTE — Telephone Encounter (Signed)
 PA request has been Submitted. New Encounter has been or will be created for follow up. For additional info see Pharmacy Prior Auth telephone encounter from 02/27/24.

## 2024-02-27 NOTE — Telephone Encounter (Signed)
 Pharmacy Patient Advocate Encounter   Received notification from Onbase that prior authorization for Febuxostat  40mg  tablets is required/requested.   Insurance verification completed.   The patient is insured through [facility].   Per test claim: PA required; PA submitted to above mentioned insurance via Prompt PA Key/confirmation #/[id number] Status is pending

## 2024-02-28 ENCOUNTER — Other Ambulatory Visit (HOSPITAL_COMMUNITY): Payer: Self-pay

## 2024-02-28 NOTE — Telephone Encounter (Signed)
 Pharmacy Patient Advocate Encounter  Received notification from [employer] that Prior Authorization for Febuxostat  40mg  tablets has been APPROVED from 02/28/2024 to 02/26/2025. Ran test claim, Copay is $18.60. This test claim was processed through [facility]- copay amounts may vary at other pharmacies due to pharmacy/plan contracts, or as the patient moves through the different stages of their insurance plan.   PA #/Case ID/Reference #: [id number]

## 2024-05-21 ENCOUNTER — Encounter: Payer: Self-pay | Admitting: Family Medicine

## 2024-05-21 ENCOUNTER — Other Ambulatory Visit: Payer: Self-pay | Admitting: Family Medicine

## 2024-05-21 ENCOUNTER — Ambulatory Visit: Admitting: Family Medicine

## 2024-05-21 VITALS — BP 138/84 | HR 69 | Ht 76.0 in | Wt 276.4 lb

## 2024-05-21 DIAGNOSIS — I10 Essential (primary) hypertension: Secondary | ICD-10-CM | POA: Diagnosis not present

## 2024-05-21 DIAGNOSIS — F5104 Psychophysiologic insomnia: Secondary | ICD-10-CM | POA: Diagnosis not present

## 2024-05-21 DIAGNOSIS — R7309 Other abnormal glucose: Secondary | ICD-10-CM | POA: Diagnosis not present

## 2024-05-21 DIAGNOSIS — Z0000 Encounter for general adult medical examination without abnormal findings: Secondary | ICD-10-CM | POA: Diagnosis not present

## 2024-05-21 DIAGNOSIS — Z8249 Family history of ischemic heart disease and other diseases of the circulatory system: Secondary | ICD-10-CM

## 2024-05-21 DIAGNOSIS — Z789 Other specified health status: Secondary | ICD-10-CM | POA: Diagnosis not present

## 2024-05-21 DIAGNOSIS — M1A09X0 Idiopathic chronic gout, multiple sites, without tophus (tophi): Secondary | ICD-10-CM

## 2024-05-21 DIAGNOSIS — Z13220 Encounter for screening for lipoid disorders: Secondary | ICD-10-CM

## 2024-05-21 MED ORDER — LOSARTAN POTASSIUM 25 MG PO TABS: 25.0000 mg | ORAL_TABLET | Freq: Every day | ORAL | 1 refills | Status: AC

## 2024-05-21 MED ORDER — PREDNISONE 10 MG PO TABS: ORAL_TABLET | ORAL | 0 refills | Status: AC

## 2024-05-21 NOTE — Patient Instructions (Addendum)
 Thank you for coming to the office today.  Start Losartan  25mg  daily for BP elevation and kidney protection and gout reduction.  ---------------  Back up plan Rx Prednisone  ordered for potential future flare. For gout flare. Take 5 tablets (50 mg total) by mouth daily with breakfast for 3 days, THEN 4 tablets (40 mg total) daily with breakfast for 3 days, THEN 3 tablets (30 mg total) daily with breakfast for 3 days, THEN 2 tablets (20 mg total) daily with breakfast for 3 days, THEN 1 tablet (10 mg total) daily with breakfast for 3 days.  Labs next wee 1/27 2pm  Adjust Uloric  40mg  either stay or inc to 80 based on uric acid level  Chemistry, blood count, Lipid cholesterol, Uric Acid, Thyroid, PSA Prostate MMR Measles, Hep B vaccine status.  You have been referred for a Coronary Calcium Score Cardiac CT Scan. This is a screening test for patients aged 58-50+ with cardiovascular risk factors or who are healthy but would be interested in Cardiovascular Screening for heart disease. Even if there is a family history of heart disease, this imaging can be useful. Typically it can be done every 5+ years or at a different timeline we agree on  The scan will look at the chest and mainly focus on the heart and identify early signs of calcium build up or blockages within the heart arteries. It is not 100% accurate for identifying blockages or heart disease, but it is useful to help us  predict who may have some early changes or be at risk in the future for a heart attack or cardiovascular problem.  The results are reviewed by a Cardiologist and they will document the results. It should become available on MyChart. Typically the results are divided into percentiles based on other patients of the same demographic and age. So it will compare your risk to others similar to you. If you have a higher score >99 or higher percentile >75%tile, it is recommended to consider Statin cholesterol therapy and or  referral to Cardiologist. I will try to help explain your results and if we have questions we can contact the Cardiologist.  You will be contacted for scheduling. Usually it is done at any imaging facility through [facility], [facility] or [facility].  The cost is $99 flat fee total and it does not go through insurance, so no authorization is required.   Please schedule a Follow-up Appointment to: Return in about 3 months (around 08/19/2024) for 3 month Follow-up HTN, Gout, updates.  If you have any other questions or concerns, please feel free to call the office or send a message through MyChart. You may also schedule an earlier appointment if necessary.  Additionally, you may be receiving a survey about your experience at our office within a few days to 1 week by e-mail or mail. We value your feedback.  Marsa Officer, DO [facility], [place]

## 2024-05-21 NOTE — Progress Notes (Unsigned)
 "  Subjective:    Patient ID: Gregory Brandt, male    DOB: 15-Jun-1980, 44 y.o.   MRN: [id number]  Gregory Brandt is a 44 y.o. male presenting on 05/21/2024 for Annual Exam   HPI  Discussed the use of AI scribe software for clinical note transcription with the patient, who gave verbal consent to proceed.  History of Present Illness   Chronic Gout Polyarticular joint pain and gout flares Last visit Dr [facility] 07/05/22 Previously on Allopurinol , failed.   - Recurrent gout attacks since discontinuing febuxostat , affecting multiple joints including neck and shoulders - Acute gout attack two weeks ago while at the beach - Previously prescribed febuxostat  40 mg by rheumatology, which effectively controlled symptoms - Discontinued febuxostat  due to increased cost from $50 to $350 Allopurinol  previously trialed without success - Seeking to restart febuxostat  due to prior efficacy Previously Uric Acid went from 9.3 down to 5.6   ***Question Uloric  if effective  Elevated BP ***left foot pain ?***gout and neck pain ***under pad of toes  ***taking Uloric  in AM works better    Sleep disturbance / insomnia - Difficulty initiating sleep due to inability to 'turn off' mind at night - Attempts at improving sleep hygiene and lifestyle changes, including reducing sugar intake and quitting tobacco use - No use of melatonin   Hypertriglyceridemia - History of elevated triglycerides, reportedly in the 400-500 mg/dL range - Belief that hypertriglyceridemia may be hereditary   Health Maintenance: ***     02/19/2024    2:46 PM 03/08/2022    8:16 AM 01/26/2022   10:10 AM  Depression screen PHQ 2/9  Decreased Interest 2 0 0  Down, Depressed, Hopeless 0 0 0  PHQ - 2 Score 2 0 0  Altered sleeping 3 3 3   Tired, decreased energy 2 3 3   Change in appetite 0 3 3  Feeling bad or failure about yourself  0 1 1  Trouble concentrating 0 0 0  Moving  slowly or fidgety/restless 0 0 0  Suicidal thoughts 0 0 0  PHQ-9 Score 7  10  10    Difficult doing work/chores Somewhat difficult Very difficult Very difficult     Data saved with a previous flowsheet row definition       02/19/2024    2:47 PM 03/08/2022    8:16 AM 01/26/2022   10:10 AM  GAD 7 : Generalized Anxiety Score  Nervous, Anxious, on Edge 0  0  0   Control/stop worrying 0  0  0   Worry too much - different things 0  0  0   Trouble relaxing 0  0  0   Restless 0  0  0   Easily annoyed or irritable 0  2  2   Afraid - awful might happen 0  0  0   Total GAD 7 Score 0 2 2  Anxiety Difficulty Somewhat difficult Not difficult at all Not difficult at all     Data saved with a previous flowsheet row definition     Past Medical History:  Diagnosis Date   Gout    Past Surgical History:  Procedure Laterality Date   VASECTOMY  06/23/2022   Social History   Socioeconomic History   Marital status: Married    Spouse name: Not on file   Number of children: Not on file   Years of education: Not on file   Highest education level: Not on file  Occupational  History   Not on file  Tobacco Use   Smoking status: Never    Passive exposure: Past   Smokeless tobacco: Current    Types: Chew   Tobacco comments:    Smokeless tobacco 1 can for 25 years  Vaping Use   Vaping status: Never Used  Substance and Sexual Activity   Alcohol use: Yes    Comment: rarely   Drug use: Never   Sexual activity: Not on file  Other Topics Concern   Not on file  Social History Narrative   Not on file   Social Drivers of Health   Tobacco Use: High Risk (05/21/2024)   Patient History    Smoking Tobacco Use: Never    Smokeless Tobacco Use: Current    Passive Exposure: Past  Financial Resource Strain: Not on file  Food Insecurity: Not on file  Transportation Needs: Not on file  Physical Activity: Not on file  Stress: Not on file  Social Connections: Not on file  Intimate Partner  Violence: Not on file  Depression (PHQ2-9): Medium Risk (02/19/2024)   Depression (PHQ2-9)    PHQ-2 Score: 7  Alcohol Screen: Not on file  Housing: Not on file  Utilities: Not on file  Health Literacy: Not on file   Family History  Problem Relation Age of Onset   Heart disease Father    Stroke Father    Heart disease Paternal Grandfather    Heart attack Paternal Grandfather 13       multiple   Current Outpatient Medications on File Prior to Visit  Medication Sig   febuxostat  (ULORIC ) 40 MG tablet Take 1 tablet (40 mg total) by mouth daily.   No current facility-administered medications on file prior to visit.    Review of Systems Per HPI unless specifically indicated above     Objective:    BP (!) 150/96 (BP Location: Right Arm, Patient Position: Sitting, Cuff Size: Large)   Pulse 69   Ht 6' 4 (1.93 m)   Wt 276 lb 6 oz (125.4 kg)   SpO2 97%   BMI 33.64 kg/m   Wt Readings from Last 3 Encounters:  05/21/24 276 lb 6 oz (125.4 kg)  02/19/24 272 lb (123.4 kg)  09/01/22 265 lb (120.2 kg)    Physical Exam  Results for orders placed or performed in visit on 02/19/24  Uric acid   Collection Time: 02/19/24  4:00 PM  Result Value Ref Range   Uric Acid, Serum 9.6 (H) 4.0 - 8.0 mg/dL  Comprehensive metabolic panel with GFR   Collection Time: 02/19/24  4:00 PM  Result Value Ref Range   Glucose, Bld 89 65 - 99 mg/dL   BUN 14 7 - 25 mg/dL   Creat 8.72 9.39 - 8.70 mg/dL   eGFR 72 > OR = 60 fO/fpw/8.26f7   BUN/Creatinine Ratio SEE NOTE: 6 - 22 (calc)   Sodium 141 135 - 146 mmol/L   Potassium 4.6 3.5 - 5.3 mmol/L   Chloride 102 98 - 110 mmol/L   CO2 29 20 - 32 mmol/L   Calcium 10.2 8.6 - 10.3 mg/dL   Total Protein 7.6 6.1 - 8.1 g/dL   Albumin 4.9 3.6 - 5.1 g/dL   Globulin 2.7 1.9 - 3.7 g/dL (calc)   AG Ratio 1.8 1.0 - 2.5 (calc)   Total Bilirubin 0.6 0.2 - 1.2 mg/dL   Alkaline phosphatase (APISO) 54 36 - 130 U/L   AST 19 10 - 40 U/L   ALT  42 9 - 46 U/L  CBC with  Differential/Platelet   Collection Time: 02/19/24  4:00 PM  Result Value Ref Range   WBC 9.4 3.8 - 10.8 Thousand/uL   RBC 4.96 4.20 - 5.80 Million/uL   Hemoglobin 15.5 13.2 - 17.1 g/dL   HCT 54.9 61.4 - 49.9 %   MCV 90.7 80.0 - 100.0 fL   MCH 31.3 27.0 - 33.0 pg   MCHC 34.4 32.0 - 36.0 g/dL   RDW 86.9 88.9 - 84.9 %   Platelets 216 140 - 400 Thousand/uL   MPV 10.1 7.5 - 12.5 fL   Neutro Abs 5,922 1,500 - 7,800 cells/uL   Absolute Lymphocytes 2,472 850 - 3,900 cells/uL   Absolute Monocytes 714 200 - 950 cells/uL   Eosinophils Absolute 197 15 - 500 cells/uL   Basophils Absolute 94 0 - 200 cells/uL   Neutrophils Relative % 63 %   Total Lymphocyte 26.3 %   Monocytes Relative 7.6 %   Eosinophils Relative 2.1 %   Basophils Relative 1.0 %      Assessment & Plan:   Problem List Items Addressed This Visit   None Visit Diagnoses       Annual physical exam    -  Primary        Updated Health Maintenance information ***- Reviewed recent lab results with patient Encouraged improvement to lifestyle with diet and exercise -*** Goal of weight loss  Assessment and Plan Assessment & Plan   ***after Uric Acid lab review 1/27 - we can consider do we keep 40mg  or dose inc to 80mg    No orders of the defined types were placed in this encounter.   No orders of the defined types were placed in this encounter.    Follow up plan: No follow-ups on file.  ***CMET CBC Lipid Uric Acid A1c TSH PSA, check lab titer MMR, Hep B titer add Urinalysis ***Reflex culture  Marsa Officer, DO [facility] 05/21/2024, 3:19 PM  "

## 2024-05-22 ENCOUNTER — Other Ambulatory Visit: Payer: Self-pay | Admitting: Family Medicine

## 2024-05-22 DIAGNOSIS — R82998 Other abnormal findings in urine: Secondary | ICD-10-CM

## 2024-05-22 DIAGNOSIS — R7309 Other abnormal glucose: Secondary | ICD-10-CM

## 2024-05-22 DIAGNOSIS — Z13220 Encounter for screening for lipoid disorders: Secondary | ICD-10-CM

## 2024-05-22 DIAGNOSIS — M1A09X0 Idiopathic chronic gout, multiple sites, without tophus (tophi): Secondary | ICD-10-CM

## 2024-05-22 DIAGNOSIS — Z0000 Encounter for general adult medical examination without abnormal findings: Secondary | ICD-10-CM

## 2024-05-22 DIAGNOSIS — Z789 Other specified health status: Secondary | ICD-10-CM

## 2024-05-22 DIAGNOSIS — I10 Essential (primary) hypertension: Secondary | ICD-10-CM

## 2024-05-22 DIAGNOSIS — Z125 Encounter for screening for malignant neoplasm of prostate: Secondary | ICD-10-CM

## 2024-05-28 ENCOUNTER — Other Ambulatory Visit

## 2024-05-28 DIAGNOSIS — R82998 Other abnormal findings in urine: Secondary | ICD-10-CM

## 2024-05-28 DIAGNOSIS — Z13220 Encounter for screening for lipoid disorders: Secondary | ICD-10-CM

## 2024-05-28 DIAGNOSIS — M1A09X0 Idiopathic chronic gout, multiple sites, without tophus (tophi): Secondary | ICD-10-CM

## 2024-05-28 DIAGNOSIS — Z0000 Encounter for general adult medical examination without abnormal findings: Secondary | ICD-10-CM

## 2024-05-28 DIAGNOSIS — Z125 Encounter for screening for malignant neoplasm of prostate: Secondary | ICD-10-CM

## 2024-05-28 DIAGNOSIS — I10 Essential (primary) hypertension: Secondary | ICD-10-CM

## 2024-05-28 DIAGNOSIS — Z789 Other specified health status: Secondary | ICD-10-CM

## 2024-05-28 DIAGNOSIS — R7309 Other abnormal glucose: Secondary | ICD-10-CM

## 2024-05-29 LAB — COMPREHENSIVE METABOLIC PANEL WITH GFR
AG Ratio: 1.8 (calc) (ref 1.0–2.5)
ALT: 34 U/L (ref 9–46)
AST: 14 U/L (ref 10–40)
Albumin: 4.9 g/dL (ref 3.6–5.1)
Alkaline phosphatase (APISO): 69 U/L (ref 36–130)
BUN: 16 mg/dL (ref 7–25)
CO2: 28 mmol/L (ref 20–32)
Calcium: 9.8 mg/dL (ref 8.6–10.3)
Chloride: 102 mmol/L (ref 98–110)
Creat: 1.01 mg/dL (ref 0.60–1.29)
Globulin: 2.8 g/dL (ref 1.9–3.7)
Glucose, Bld: 87 mg/dL (ref 65–99)
Potassium: 4.4 mmol/L (ref 3.5–5.3)
Sodium: 141 mmol/L (ref 135–146)
Total Bilirubin: 0.6 mg/dL (ref 0.2–1.2)
Total Protein: 7.7 g/dL (ref 6.1–8.1)
eGFR: 95 mL/min/{1.73_m2}

## 2024-05-29 LAB — HEMOGLOBIN A1C
Hgb A1c MFr Bld: 5.3 %
Mean Plasma Glucose: 105 mg/dL
eAG (mmol/L): 5.8 mmol/L

## 2024-05-29 LAB — CBC WITH DIFFERENTIAL/PLATELET
Absolute Lymphocytes: 2702 {cells}/uL (ref 850–3900)
Absolute Monocytes: 868 {cells}/uL (ref 200–950)
Basophils Absolute: 70 {cells}/uL (ref 0–200)
Basophils Relative: 0.5 %
Eosinophils Absolute: 42 {cells}/uL (ref 15–500)
Eosinophils Relative: 0.3 %
HCT: 46.8 % (ref 39.4–51.1)
Hemoglobin: 15.9 g/dL (ref 13.2–17.1)
MCH: 31.2 pg (ref 27.0–33.0)
MCHC: 34 g/dL (ref 31.6–35.4)
MCV: 91.9 fL (ref 81.4–101.7)
MPV: 10.3 fL (ref 7.5–12.5)
Monocytes Relative: 6.2 %
Neutro Abs: 10318 {cells}/uL — ABNORMAL HIGH (ref 1500–7800)
Neutrophils Relative %: 73.7 %
Platelets: 264 10*3/uL (ref 140–400)
RBC: 5.09 Million/uL (ref 4.20–5.80)
RDW: 13.1 % (ref 11.0–15.0)
Total Lymphocyte: 19.3 %
WBC: 14 10*3/uL — ABNORMAL HIGH (ref 3.8–10.8)

## 2024-05-29 LAB — URINALYSIS, ROUTINE W REFLEX MICROSCOPIC
Bilirubin Urine: NEGATIVE
Glucose, UA: NEGATIVE
Hgb urine dipstick: NEGATIVE
Ketones, ur: NEGATIVE
Leukocytes,Ua: NEGATIVE
Nitrite: NEGATIVE
Protein, ur: NEGATIVE
Specific Gravity, Urine: 1.019 (ref 1.001–1.035)
pH: 5.5 (ref 5.0–8.0)

## 2024-05-29 LAB — TSH: TSH: 0.57 m[IU]/L (ref 0.40–4.50)

## 2024-05-29 LAB — HEPATITIS B SURFACE ANTIBODY,QUALITATIVE: Hep B S Ab: NONREACTIVE

## 2024-05-29 LAB — LIPID PANEL
Cholesterol: 189 mg/dL
HDL: 37 mg/dL — ABNORMAL LOW
LDL Cholesterol (Calc): 117 mg/dL — ABNORMAL HIGH
Non-HDL Cholesterol (Calc): 152 mg/dL — ABNORMAL HIGH
Total CHOL/HDL Ratio: 5.1 (calc) — ABNORMAL HIGH
Triglycerides: 248 mg/dL — ABNORMAL HIGH

## 2024-05-29 LAB — MEASLES/MUMPS/RUBELLA IMMUNITY
Mumps IgG: 49.6 [AU]/ml
Rubella: <0.9 {index} — ABNORMAL LOW
Rubeola IgG: 154 [AU]/ml

## 2024-05-29 LAB — URIC ACID: Uric Acid, Serum: 5.5 mg/dL (ref 4.0–8.0)

## 2024-05-29 LAB — PSA: PSA: 0.54 ng/mL

## 2024-05-31 ENCOUNTER — Ambulatory Visit: Payer: Self-pay | Admitting: Family Medicine

## 2024-06-06 ENCOUNTER — Ambulatory Visit
Admission: RE | Admit: 2024-06-06 | Discharge: 2024-06-06 | Disposition: A | Payer: Self-pay | Source: Ambulatory Visit | Attending: Family Medicine | Admitting: Family Medicine

## 2024-06-06 DIAGNOSIS — Z8249 Family history of ischemic heart disease and other diseases of the circulatory system: Secondary | ICD-10-CM

## 2024-08-20 ENCOUNTER — Ambulatory Visit: Admitting: Family Medicine
